# Patient Record
Sex: Female | Born: 1975 | Race: White | Hispanic: No | Smoking: Former smoker
Health system: Southern US, Community
[De-identification: ages and names within clinical notes are randomized; demographics above are authoritative.]

## PROBLEM LIST (undated history)

## (undated) ENCOUNTER — Inpatient Hospital Stay (HOSPITAL_COMMUNITY): Payer: Self-pay

## (undated) DIAGNOSIS — Z789 Other specified health status: Secondary | ICD-10-CM

## (undated) DIAGNOSIS — F419 Anxiety disorder, unspecified: Secondary | ICD-10-CM

## (undated) DIAGNOSIS — Z87891 Personal history of nicotine dependence: Secondary | ICD-10-CM

## (undated) HISTORY — DX: Anxiety disorder, unspecified: F41.9

## (undated) HISTORY — PX: TONSILLECTOMY: SUR1361

## (undated) HISTORY — PX: WISDOM TOOTH EXTRACTION: SHX21

---

## 2009-11-11 ENCOUNTER — Inpatient Hospital Stay (HOSPITAL_COMMUNITY): Admission: AD | Admit: 2009-11-11 | Discharge: 2009-11-13 | Payer: Self-pay | Admitting: Obstetrics and Gynecology

## 2010-07-14 LAB — CBC
HCT: 37.5 % (ref 36.0–46.0)
HCT: 41.9 % (ref 36.0–46.0)
Hemoglobin: 14.4 g/dL (ref 12.0–15.0)
MCH: 31.6 pg (ref 26.0–34.0)
MCHC: 34.3 g/dL (ref 30.0–36.0)
MCHC: 34.5 g/dL (ref 30.0–36.0)
MCV: 93.2 fL (ref 78.0–100.0)
RDW: 12.9 % (ref 11.5–15.5)

## 2010-07-14 LAB — RH IMMUNE GLOB WKUP(>/=20WKS)(NOT WOMEN'S HOSP)

## 2010-09-05 LAB — HIV ANTIBODY (ROUTINE TESTING W REFLEX): HIV: NONREACTIVE

## 2010-09-05 LAB — ABO/RH: RH Type: NEGATIVE

## 2010-09-05 LAB — HEPATITIS B SURFACE ANTIGEN: Hepatitis B Surface Ag: NEGATIVE

## 2010-09-05 LAB — RUBELLA ANTIBODY, IGM: Rubella: IMMUNE

## 2011-03-04 LAB — STREP B DNA PROBE: GBS: NEGATIVE

## 2011-03-26 ENCOUNTER — Encounter (HOSPITAL_COMMUNITY): Payer: Self-pay | Admitting: *Deleted

## 2011-03-26 ENCOUNTER — Inpatient Hospital Stay (HOSPITAL_COMMUNITY)
Admission: AD | Admit: 2011-03-26 | Discharge: 2011-03-28 | DRG: 373 | Disposition: A | Discharge status: Home / Self Care | Payer: BC Managed Care – PPO | Source: Ambulatory Visit | Attending: Obstetrics and Gynecology | Admitting: Obstetrics and Gynecology

## 2011-03-26 HISTORY — DX: Other specified health status: Z78.9

## 2011-03-26 NOTE — Progress Notes (Signed)
Pt reports contractions q 4 minutes. Denies bleeding or ROM. G2P1, SVE 3 cm this am

## 2011-03-26 NOTE — ED Notes (Signed)
Pt had office visit today; MDoffice SVE was 3 cm and MD stripped pt's membranes;

## 2011-03-27 ENCOUNTER — Encounter (HOSPITAL_COMMUNITY): Payer: Self-pay | Admitting: Anesthesiology

## 2011-03-27 ENCOUNTER — Encounter (HOSPITAL_COMMUNITY): Payer: Self-pay | Admitting: Family Medicine

## 2011-03-27 ENCOUNTER — Inpatient Hospital Stay (HOSPITAL_COMMUNITY): Payer: BC Managed Care – PPO | Admitting: Anesthesiology

## 2011-03-27 LAB — CBC
HCT: 39.6 % (ref 36.0–46.0)
Hemoglobin: 13.4 g/dL (ref 12.0–15.0)
RDW: 13.7 % (ref 11.5–15.5)
WBC: 12.1 10*3/uL — ABNORMAL HIGH (ref 4.0–10.5)

## 2011-03-27 MED ORDER — LACTATED RINGERS IV SOLN: 500.0000 mL | Freq: Once | INTRAVENOUS | Status: DC

## 2011-03-27 MED ORDER — WITCH HAZEL-GLYCERIN EX PADS: 1.0000 "application " | MEDICATED_PAD | CUTANEOUS | Status: DC | PRN

## 2011-03-27 MED ORDER — SODIUM BICARBONATE 8.4 % IV SOLN
INTRAVENOUS | Status: DC | PRN
  Administered 2011-03-27: 5 mL via EPIDURAL

## 2011-03-27 MED ORDER — ONDANSETRON HCL 4 MG/2ML IJ SOLN: 4.0000 mg | INTRAMUSCULAR | Status: DC | PRN

## 2011-03-27 MED ORDER — FENTANYL 2.5 MCG/ML BUPIVACAINE 1/10 % EPIDURAL INFUSION (WH - ANES)
INTRAMUSCULAR | Status: DC | PRN
  Administered 2011-03-27: 14 mL/h via EPIDURAL

## 2011-03-27 MED ORDER — OXYCODONE-ACETAMINOPHEN 5-325 MG PO TABS
1.0000 | ORAL_TABLET | ORAL | Status: DC | PRN
  Administered 2011-03-27 – 2011-03-28 (×4): 1 via ORAL
  Filled 2011-03-27 (×4): qty 1

## 2011-03-27 MED ORDER — DIPHENHYDRAMINE HCL 25 MG PO CAPS: 25.0000 mg | ORAL_CAPSULE | Freq: Four times a day (QID) | ORAL | Status: DC | PRN

## 2011-03-27 MED ORDER — PHENYLEPHRINE 40 MCG/ML (10ML) SYRINGE FOR IV PUSH (FOR BLOOD PRESSURE SUPPORT): 80.0000 ug | PREFILLED_SYRINGE | INTRAVENOUS | Status: DC | PRN

## 2011-03-27 MED ORDER — TETANUS-DIPHTH-ACELL PERTUSSIS 5-2.5-18.5 LF-MCG/0.5 IM SUSP: 0.5000 mL | Freq: Once | INTRAMUSCULAR | Status: DC

## 2011-03-27 MED ORDER — LIDOCAINE HCL (PF) 1 % IJ SOLN
30.0000 mL | INTRAMUSCULAR | Status: DC | PRN
  Filled 2011-03-27: qty 30

## 2011-03-27 MED ORDER — IBUPROFEN 600 MG PO TABS
600.0000 mg | ORAL_TABLET | Freq: Four times a day (QID) | ORAL | Status: DC
  Administered 2011-03-27 – 2011-03-28 (×5): 600 mg via ORAL
  Filled 2011-03-27 (×5): qty 1

## 2011-03-27 MED ORDER — EPHEDRINE 5 MG/ML INJ
10.0000 mg | INTRAVENOUS | Status: DC | PRN
  Filled 2011-03-27: qty 4

## 2011-03-27 MED ORDER — EPHEDRINE 5 MG/ML INJ: 10.0000 mg | INTRAVENOUS | Status: DC | PRN

## 2011-03-27 MED ORDER — OXYTOCIN BOLUS FROM INFUSION
500.0000 mL | Freq: Once | INTRAVENOUS | Status: DC
  Filled 2011-03-27: qty 1000
  Filled 2011-03-27: qty 500

## 2011-03-27 MED ORDER — OXYCODONE-ACETAMINOPHEN 5-325 MG PO TABS: 2.0000 | ORAL_TABLET | ORAL | Status: DC | PRN

## 2011-03-27 MED ORDER — SENNOSIDES-DOCUSATE SODIUM 8.6-50 MG PO TABS
2.0000 | ORAL_TABLET | Freq: Every day | ORAL | Status: DC
  Administered 2011-03-27: 2 via ORAL

## 2011-03-27 MED ORDER — FENTANYL 2.5 MCG/ML BUPIVACAINE 1/10 % EPIDURAL INFUSION (WH - ANES)
14.0000 mL/h | INTRAMUSCULAR | Status: DC
  Administered 2011-03-27: 14 mL/h via EPIDURAL
  Filled 2011-03-27 (×2): qty 60

## 2011-03-27 MED ORDER — BUTORPHANOL TARTRATE 2 MG/ML IJ SOLN: 1.0000 mg | INTRAMUSCULAR | Status: DC | PRN

## 2011-03-27 MED ORDER — FLEET ENEMA 7-19 GM/118ML RE ENEM: 1.0000 | ENEMA | RECTAL | Status: DC | PRN

## 2011-03-27 MED ORDER — LACTATED RINGERS IV SOLN
500.0000 mL | INTRAVENOUS | Status: DC | PRN
  Administered 2011-03-27 (×2): 1000 mL via INTRAVENOUS

## 2011-03-27 MED ORDER — BENZOCAINE-MENTHOL 20-0.5 % EX AERO
INHALATION_SPRAY | CUTANEOUS | Status: AC
  Administered 2011-03-27: 11:00:00
  Filled 2011-03-27: qty 56

## 2011-03-27 MED ORDER — DIPHENHYDRAMINE HCL 50 MG/ML IJ SOLN: 12.5000 mg | INTRAMUSCULAR | Status: DC | PRN

## 2011-03-27 MED ORDER — LANOLIN HYDROUS EX OINT: TOPICAL_OINTMENT | CUTANEOUS | Status: DC | PRN

## 2011-03-27 MED ORDER — BENZOCAINE-MENTHOL 20-0.5 % EX AERO: 1.0000 "application " | INHALATION_SPRAY | CUTANEOUS | Status: DC | PRN

## 2011-03-27 MED ORDER — OXYTOCIN 20 UNITS IN LACTATED RINGERS INFUSION - SIMPLE
125.0000 mL/h | Freq: Once | INTRAVENOUS | Status: AC
  Administered 2011-03-27: 999 mL/h via INTRAVENOUS

## 2011-03-27 MED ORDER — LACTATED RINGERS IV SOLN
INTRAVENOUS | Status: DC
  Administered 2011-03-27: 125 mL/h via INTRAVENOUS
  Administered 2011-03-27: via INTRAVENOUS
  Administered 2011-03-27: 125 mL/h via INTRAVENOUS

## 2011-03-27 MED ORDER — ACETAMINOPHEN 325 MG PO TABS: 650.0000 mg | ORAL_TABLET | ORAL | Status: DC | PRN

## 2011-03-27 MED ORDER — CITRIC ACID-SODIUM CITRATE 334-500 MG/5ML PO SOLN: 30.0000 mL | ORAL | Status: DC | PRN

## 2011-03-27 MED ORDER — SIMETHICONE 80 MG PO CHEW: 80.0000 mg | CHEWABLE_TABLET | ORAL | Status: DC | PRN

## 2011-03-27 MED ORDER — MEASLES, MUMPS & RUBELLA VAC ~~LOC~~ INJ
0.5000 mL | INJECTION | Freq: Once | SUBCUTANEOUS | Status: DC
  Filled 2011-03-27: qty 0.5

## 2011-03-27 MED ORDER — PHENYLEPHRINE 40 MCG/ML (10ML) SYRINGE FOR IV PUSH (FOR BLOOD PRESSURE SUPPORT)
80.0000 ug | PREFILLED_SYRINGE | INTRAVENOUS | Status: DC | PRN
  Filled 2011-03-27: qty 5

## 2011-03-27 MED ORDER — IBUPROFEN 600 MG PO TABS: 600.0000 mg | ORAL_TABLET | Freq: Four times a day (QID) | ORAL | Status: DC | PRN

## 2011-03-27 MED ORDER — ONDANSETRON HCL 4 MG/2ML IJ SOLN: 4.0000 mg | Freq: Four times a day (QID) | INTRAMUSCULAR | Status: DC | PRN

## 2011-03-27 MED ORDER — MEDROXYPROGESTERONE ACETATE 150 MG/ML IM SUSP: 150.0000 mg | INTRAMUSCULAR | Status: DC | PRN

## 2011-03-27 MED ORDER — DIBUCAINE 1 % RE OINT: 1.0000 "application " | TOPICAL_OINTMENT | RECTAL | Status: DC | PRN

## 2011-03-27 MED ORDER — ONDANSETRON HCL 4 MG PO TABS: 4.0000 mg | ORAL_TABLET | ORAL | Status: DC | PRN

## 2011-03-27 MED ORDER — PRENATAL PLUS 27-1 MG PO TABS
1.0000 | ORAL_TABLET | Freq: Every day | ORAL | Status: DC
  Administered 2011-03-27 – 2011-03-28 (×2): 1 via ORAL
  Filled 2011-03-27 (×2): qty 1

## 2011-03-27 NOTE — Progress Notes (Signed)
Transferred to [facility] 142, report given to [place], Charity fundraiser

## 2011-03-27 NOTE — Progress Notes (Signed)
SVD of vigerous female infant w/ apgars of 8,9.  Placenta delivered spontaneous w/ 3VC.   2nd degree lac repaired w/ 3-0 vicryl rapide.  Fundus firm.  EBL 400cc .

## 2011-03-27 NOTE — H&P (Signed)
35 yo G2P1 @39 +6 wks presents in labor.  No lof or vb.  + FM.  Pregnancy uncomplicated.  Past History - See hollister, GBS neg  AF, VSS Reassuring FHT, Toco Q2-4 Gen - uncomfortable w/ ctx Abd - gravid, NT Cvx 4cm at admission per RN exam  A/P:  Admit, epidural prn, exp mngt.

## 2011-03-27 NOTE — Anesthesia Procedure Notes (Signed)
Epidural Patient location during procedure: OB  Preanesthetic Checklist Completed: patient identified, site marked, surgical consent, pre-op evaluation, timeout performed, IV checked, risks and benefits discussed and monitors and equipment checked  Epidural Patient position: sitting Prep: site prepped and draped and DuraPrep Patient monitoring: continuous pulse ox and blood pressure Approach: midline Injection technique: LOR air  Needle:  Needle type: Tuohy  Needle gauge: 17 G Needle length: 9 cm Needle insertion depth: 7 cm Catheter type: closed end flexible Catheter size: 19 Gauge Catheter at skin depth: 15 cm Test dose: negative  Assessment Events: blood not aspirated, injection not painful, no injection resistance, negative IV test and no paresthesia  Additional Notes Dosing of Epidural:  1st dose, through needle ............................................Marland Kitchen epi 1:200K + Xylocaine 40 mg  2nd dose, through catheter, after waiting 3 minutes...Marland KitchenMarland Kitchenepi 1:200K + Xylocaine 60 mg  3rd dose, through catheter after waiting 3 minutes .............................Marcaine   5mg    ( mg Marcaine are expressed as equivilent  cc's medication removed from the 0.1%Bupiv / fentanyl syringe from L&D pump)  ( 2% Xylo charted as a single dose in Epic Meds for ease of charting; actual dosing was fractionated as above, for saftey's sake)  As each dose occurred, patient was free of IV sx; and patient exhibited no evidence of SA injection.  Patient is more comfortable after epidural dosed. Please see RN's note for documentation of vital signs,and FHR which are stable.

## 2011-03-27 NOTE — Progress Notes (Signed)
NSVD of a viable female.  

## 2011-03-27 NOTE — Anesthesia Preprocedure Evaluation (Signed)
Anesthesia Evaluation  Patient identified by MRN, date of birth, ID band Patient awake    Reviewed: Allergy & Precautions, H&P , Patient's Chart, lab work & pertinent test results  Airway Mallampati: II TM Distance: >3 FB Neck ROM: full    Dental  (+) Teeth Intact   Pulmonary  clear to auscultation        Cardiovascular regular Normal    Neuro/Psych    GI/Hepatic GERD-  ,  Endo/Other  Morbid obesity  Renal/GU      Musculoskeletal   Abdominal   Peds  Hematology   Anesthesia Other Findings       Reproductive/Obstetrics (+) Pregnancy                           Anesthesia Physical Anesthesia Plan  ASA: III  Anesthesia Plan: Epidural   Post-op Pain Management:    Induction:   Airway Management Planned:   Additional Equipment:   Intra-op Plan:   Post-operative Plan:   Informed Consent: I have reviewed the patients History and Physical, chart, labs and discussed the procedure including the risks, benefits and alternatives for the proposed anesthesia with the patient or authorized representative who has indicated his/her understanding and acceptance.   Dental Advisory Given  Plan Discussed with:   Anesthesia Plan Comments: (Labs checked- platelets confirmed with RN in room. Fetal heart tracing, per RN, reported to be stable enough for sitting procedure. Discussed epidural, and patient consents to the procedure:  included risk of possible headache,backache, failed block, allergic reaction, and nerve injury. This patient was asked if she had any questions or concerns before the procedure started. )        Anesthesia Quick Evaluation

## 2011-03-28 LAB — CBC
HCT: 34.3 % — ABNORMAL LOW (ref 36.0–46.0)
MCV: 89.8 fL (ref 78.0–100.0)
RBC: 3.82 MIL/uL — ABNORMAL LOW (ref 3.87–5.11)
WBC: 10.7 10*3/uL — ABNORMAL HIGH (ref 4.0–10.5)

## 2011-03-28 MED ORDER — OXYCODONE-ACETAMINOPHEN 5-325 MG PO TABS: 1.0000 | ORAL_TABLET | ORAL | Status: AC | PRN

## 2011-03-28 MED ORDER — IBUPROFEN 600 MG PO TABS: 600.0000 mg | ORAL_TABLET | Freq: Four times a day (QID) | ORAL | Status: AC

## 2011-03-28 NOTE — Anesthesia Postprocedure Evaluation (Signed)
  Anesthesia Post-op Note  Patient: [name] [name]  Procedure(s) Performed: * No procedures listed *  Patient Location: Mother/Baby  Anesthesia Type: Epidural  Level of Consciousness: alert  and oriented  Airway and Oxygen Therapy: Patient Spontanous Breathing  Post-op Pain: mild  Post-op Assessment: Patient's Cardiovascular Status Stable and Respiratory Function Stable  Post-op Vital Signs: stable  Complications: No apparent anesthesia complications

## 2011-03-28 NOTE — Addendum Note (Signed)
Addendum  created 03/28/11 1007 by Cephus Shelling   Modules edited:Charges VN, Notes Section

## 2011-03-28 NOTE — Discharge Summary (Signed)
Obstetric Discharge Summary Reason for Admission: onset of labor Prenatal Procedures: ultrasound Intrapartum Procedures: spontaneous vaginal delivery Postpartum Procedures: none Complications-Operative and Postpartum: 2 degree perineal laceration Hemoglobin  Date Value Range Status  03/28/2011 11.3* 12.0-15.0 ([name]/dL) Final     HCT  Date Value Range Status  03/28/2011 34.3* 36.0-46.0 (%) Final    Discharge Diagnoses: Term Pregnancy-delivered  Discharge Information: Date: 03/28/2011 Activity: pelvic rest Diet: routine Medications: None, Ibuprofen and Percocet Condition: stable Instructions: refer to practice specific booklet Discharge to: home   Newborn Data: Live born female  Birth Weight: 7 lb 15 oz (3600 [name]) APGAR: 8, 9  Home with mother.  [name] [name] 03/28/2011, 8:27 AM

## 2011-03-28 NOTE — Progress Notes (Signed)
Post Partum Day 1 Subjective: no complaints, up ad lib, voiding, + flatus and desires early discharge. Desires baby circ  Objective: Blood pressure 126/82, pulse 88, temperature 97.5 F (36.4 C), temperature source Oral, resp. rate 20, height 5\' 6"  (1.676 m), weight 113.853 kg (251 lb), SpO2 96.00%, unknown if currently breastfeeding.  Physical Exam:  General: alert and cooperative Lochia: appropriate Uterine Fundus: firm Perineum intact DVT Evaluation: No evidence of DVT seen on physical exam.   Basename 03/28/11 0520 03/27/11 0027  HGB 11.3* 13.4  HCT 34.3* 39.6    Assessment/Plan: Discharge home   LOS: 2 days   Emori Mumme G 03/28/2011, 8:17 AM

## 2011-03-28 NOTE — Progress Notes (Signed)
D/W patient female infant circumcision, risks/benefits reviewed. All questions answered. 

## 2011-04-01 ENCOUNTER — Encounter (HOSPITAL_COMMUNITY): Payer: Self-pay

## 2011-04-02 ENCOUNTER — Inpatient Hospital Stay (HOSPITAL_COMMUNITY): Admission: RE | Admit: 2011-04-02 | Payer: Self-pay | Source: Ambulatory Visit

## 2013-06-27 ENCOUNTER — Inpatient Hospital Stay (HOSPITAL_COMMUNITY): Payer: BC Managed Care – PPO

## 2013-06-27 ENCOUNTER — Encounter (HOSPITAL_COMMUNITY): Payer: Self-pay

## 2013-06-27 ENCOUNTER — Inpatient Hospital Stay (HOSPITAL_COMMUNITY)
Admission: AD | Admit: 2013-06-27 | Discharge: 2013-06-27 | Disposition: A | Discharge status: Home / Self Care | Payer: BC Managed Care – PPO | Source: Ambulatory Visit | Attending: Obstetrics and Gynecology | Admitting: Obstetrics and Gynecology

## 2013-06-27 DIAGNOSIS — O09529 Supervision of elderly multigravida, unspecified trimester: Secondary | ICD-10-CM

## 2013-06-27 DIAGNOSIS — O209 Hemorrhage in early pregnancy, unspecified: Secondary | ICD-10-CM | POA: Insufficient documentation

## 2013-06-27 DIAGNOSIS — O200 Threatened abortion: Secondary | ICD-10-CM

## 2013-06-27 DIAGNOSIS — Z87891 Personal history of nicotine dependence: Secondary | ICD-10-CM | POA: Insufficient documentation

## 2013-06-27 HISTORY — DX: Other specified health status: Z78.9

## 2013-06-27 LAB — HCG, QUANTITATIVE, PREGNANCY: hCG, Beta Chain, Quant, S: 10561 m[IU]/mL — ABNORMAL HIGH (ref <5)

## 2013-06-27 LAB — URINALYSIS, ROUTINE W REFLEX MICROSCOPIC
Bilirubin Urine: NEGATIVE
Glucose, UA: NEGATIVE mg/dL
Ketones, ur: NEGATIVE mg/dL
Leukocytes, UA: NEGATIVE
Nitrite: NEGATIVE
Protein, ur: NEGATIVE mg/dL
Specific Gravity, Urine: <1.005 — ABNORMAL LOW (ref 1.005–1.030)
Urobilinogen, UA: 0.2 mg/dL (ref 0.0–1.0)
pH: 5.5 (ref 5.0–8.0)

## 2013-06-27 LAB — CBC
HCT: 39.1 % (ref 36.0–46.0)
Hemoglobin: 13 g/dL (ref 12.0–15.0)
MCH: 29 pg (ref 26.0–34.0)
MCHC: 33.2 g/dL (ref 30.0–36.0)
MCV: 87.1 fL (ref 78.0–100.0)
Platelets: 310 10*3/uL (ref 150–400)
RBC: 4.49 MIL/uL (ref 3.87–5.11)
RDW: 13.9 % (ref 11.5–15.5)
WBC: 8.4 10*3/uL (ref 4.0–10.5)

## 2013-06-27 LAB — URINE MICROSCOPIC-ADD ON

## 2013-06-27 LAB — POCT PREGNANCY, URINE: Preg Test, Ur: POSITIVE — AB

## 2013-06-27 MED ORDER — RHO D IMMUNE GLOBULIN 1500 UNIT/2ML IJ SOLN
300.0000 ug | INTRAMUSCULAR | Status: AC
  Administered 2013-06-27: 300 ug via INTRAMUSCULAR
  Filled 2013-06-27: qty 2

## 2013-06-27 NOTE — MAU Provider Note (Signed)
Chief Complaint: Vaginal Bleeding   First Provider Initiated Contact with Patient 06/27/13 0913     SUBJECTIVE HPI: [name] [name] is a 38 y.o. G4P2002 at [redacted]w[redacted]d by LMP who presents to maternity admissions reporting bright red vaginal bleeding described as soaking 1 pad in 2-3 hours, "like menstrual bleeding".  Her bleeding is accompanied by light cramping with onset this morning.  She reports she had brown spotting x2-3 days, then had bright red only when wiping yesterday and had appointment for labs in office tomorrow.  Patient's last menstrual period was 05/05/2013.  She is sure of the date but this period was unusually long with moderate bleeding, then spotting, then moderate bleeding again. She denies exposure to STDs, vaginal itching/burning, urinary symptoms, h/a, dizziness, n/v, or fever/chills.     Past Medical History  Diagnosis Date  . No pertinent past medical history   . Medical history non-contributory    Past Surgical History  Procedure Laterality Date  . Tonsillectomy    . Wisdom tooth extraction     History   Social History  . Marital Status: Married    Spouse Name: N/A    Number of Children: N/A  . Years of Education: N/A   Occupational History  . Not on file.   Social History Main Topics  . Smoking status: Former Games developer  . Smokeless tobacco: Never Used  . Alcohol Use: No  . Drug Use: No  . Sexual Activity: Yes    Birth Control/ Protection: None   Other Topics Concern  . Not on file   Social History Narrative  . No narrative on file   No current facility-administered medications on file prior to encounter.   No current outpatient prescriptions on file prior to encounter.   No Known Allergies  ROS: Pertinent items in HPI  OBJECTIVE Blood pressure 127/83, pulse 80, temperature 97.9 F (36.6 C), temperature source Oral, resp. rate 18, height 5\' 6"  (1.676 m), weight 85.503 kg (188 lb 8 oz), last menstrual period 05/05/2013, not currently  breastfeeding. GENERAL: Well-developed, well-nourished female in no acute distress.  HEENT: Normocephalic HEART: normal rate RESP: normal effort ABDOMEN: Soft, non-tender EXTREMITIES: Nontender, no edema NEURO: Alert and oriented Pelvic exam: Cervix pink, visually closed, without lesion, moderate amount dark red blood, vaginal walls and external genitalia normal Bimanual exam: Cervix 0/long/high, firm, anterior, neg CMT, uterus nontender, slightly enlarged, adnexa without tenderness, enlargement, or mass  LAB RESULTS Results for orders placed during the hospital encounter of 06/27/13 (from the past 24 hour(s))  URINALYSIS, ROUTINE W REFLEX MICROSCOPIC     Status: Abnormal   Collection Time    06/27/13  8:25 AM      Result Value Ref Range   Color, Urine YELLOW  YELLOW   APPearance CLEAR  CLEAR   Specific Gravity, Urine <1.005 (*) 1.005 - 1.030   pH 5.5  5.0 - 8.0   Glucose, UA NEGATIVE  NEGATIVE mg/dL   Hgb urine dipstick MODERATE (*) NEGATIVE   Bilirubin Urine NEGATIVE  NEGATIVE   Ketones, ur NEGATIVE  NEGATIVE mg/dL   Protein, ur NEGATIVE  NEGATIVE mg/dL   Urobilinogen, UA 0.2  0.0 - 1.0 mg/dL   Nitrite NEGATIVE  NEGATIVE   Leukocytes, UA NEGATIVE  NEGATIVE  URINE MICROSCOPIC-ADD ON     Status: None   Collection Time    06/27/13  8:25 AM      Result Value Ref Range   Squamous Epithelial / LPF RARE  RARE   RBC /  HPF 3-6  <3 RBC/hpf  POCT PREGNANCY, URINE     Status: Abnormal   Collection Time    06/27/13  8:44 AM      Result Value Ref Range   Preg Test, Ur POSITIVE (*) NEGATIVE  CBC     Status: None   Collection Time    06/27/13  9:14 AM      Result Value Ref Range   WBC 8.4  4.0 - 10.5 K/uL   RBC 4.49  3.87 - 5.11 MIL/uL   Hemoglobin 13.0  12.0 - 15.0 g/dL   HCT 96.0  45.4 - 09.8 %   MCV 87.1  78.0 - 100.0 fL   MCH 29.0  26.0 - 34.0 pg   MCHC 33.2  30.0 - 36.0 g/dL   RDW 11.9  14.7 - 82.9 %   Platelets 310  150 - 400 K/uL  HCG, QUANTITATIVE, PREGNANCY     Status:  Abnormal   Collection Time    06/27/13  9:14 AM      Result Value Ref Range   hCG, Beta Chain, Quant, S 10561 (*) <5 mIU/mL  RH IG WORKUP (INCLUDES ABO/RH)     Status: None   Collection Time    06/27/13  9:15 AM      Result Value Ref Range   Gestational Age(Wks) 6     ABO/RH(D) O NEG     Antibody Screen NEG     Unit Number [id number]     Blood Component Type RHIG     Unit division 00     Status of Unit ISSUED     Transfusion Status OK TO TRANSFUSE      IMAGING US Ob Comp Less 14 Wks  06/27/2013   CLINICAL DATA:  Vaginal bleeding, cramping, pregnant, gestational age of [redacted] weeks, 4 days by LMP  EXAM: OBSTETRIC <14 WK [place] AND TRANSVAGINAL OB US  TECHNIQUE: Both transabdominal and transvaginal ultrasound examinations were performed for complete evaluation of the gestation as well as the maternal uterus, adnexal regions, and pelvic cul-de-sac. Transvaginal technique was performed to assess early pregnancy.  COMPARISON:  None.  FINDINGS: Intrauterine gestational sac: Visualized, normal in shape, note is made of a small subchorionic hemorrhage measuring approximately 1.2 cm (image 42).  Yolk sac:  Visualized  Embryo:  Visualized  Cardiac Activity: Not definitely visualized.  CRL:   3  mm   6 w 0 d                  [place] EDC: 02/20/2014  Maternal uterus/adnexae:  The left ovary is normal in size measuring 3.2 x 1.9 x 1.9 cm. Several small punctate peripheral follicles are noted within the left ovary. No discrete left sided ovarian or adnexal mass.  The right ovary is normal in size measuring 3.4 x 2.1 x 2.3 cm. Several small peripheral follicles are noted within the right ovary. No discrete right-sided ovarian or adnexal mass.  IMPRESSION: 1. Single intrauterine gestation. Cardiac activity is not definitely identified though crown-rump length only measures 3 mm compatible with a [redacted] week gestation (estimated delivery date of 02/20/2014). Follow-up pelvic ultrasound to ensure viability is recommended. 2.  Incidental note made of a small (approximately 1.2 cm) subchorionic hemorrhage. 3. Normal appearance of the bilateral ovaries and adnexae.   Electronically Signed   By: Simonne Come M.D.   On: 06/27/2013 10:41   US Ob Transvaginal  06/27/2013   CLINICAL DATA:  Vaginal bleeding, cramping, pregnant, gestational age of  [redacted] weeks, 4 days by LMP  EXAM: OBSTETRIC <14 WK US AND TRANSVAGINAL OB US  TECHNIQUE: Both transabdominal and transvaginal ultrasound examinations were performed for complete evaluation of the gestation as well as the maternal uterus, adnexal regions, and pelvic cul-de-sac. Transvaginal technique was performed to assess early pregnancy.  COMPARISON:  None.  FINDINGS: Intrauterine gestational sac: Visualized, normal in shape, note is made of a small subchorionic hemorrhage measuring approximately 1.2 cm (image 42).  Yolk sac:  Visualized  Embryo:  Visualized  Cardiac Activity: Not definitely visualized.  CRL:   3  mm   6 w 0 d                  US EDC: 02/20/2014  Maternal uterus/adnexae:  The left ovary is normal in size measuring 3.2 x 1.9 x 1.9 cm. Several small punctate peripheral follicles are noted within the left ovary. No discrete left sided ovarian or adnexal mass.  The right ovary is normal in size measuring 3.4 x 2.1 x 2.3 cm. Several small peripheral follicles are noted within the right ovary. No discrete right-sided ovarian or adnexal mass.  IMPRESSION: 1. Single intrauterine gestation. Cardiac activity is not definitely identified though crown-rump length only measures 3 mm compatible with a [redacted] week gestation (estimated delivery date of 02/20/2014). Follow-up pelvic ultrasound to ensure viability is recommended. 2. Incidental note made of a small (approximately 1.2 cm) subchorionic hemorrhage. 3. Normal appearance of the bilateral ovaries and adnexae.   Electronically Signed   By: Simonne ComeJohn  Watts M.D.   On: 06/27/2013 10:41    ASSESSMENT Vaginal bleeding in pregnancy Small SCH  IUP 2228w1d by  U/S  PLAN Consult Dr Arelia SneddonMcComb Rhogam given in MAU Discharge home with bleeding precautions Pelvic rest F/U in office with early prenatal care Return to MAU as needed    Medication List         acetaminophen 500 MG tablet  Commonly known as:  TYLENOL  Take 500 mg by mouth every 6 (six) hours as needed for headache.       Follow-up Information   Follow up with Juluis MireMCCOMB,JOHN S, MD. (Keep scheduled appointments. Return to MAU as needed.  )    Specialty:  Obstetrics and Gynecology   Contact information:   [address], [address], [address] [place] [id number] [phone]       Sharen CounterLisa Leftwich-Kirby Certified Nurse-Midwife 06/27/2013  3:08 PM

## 2013-06-27 NOTE — MAU Note (Addendum)
Pt states has been having brown blood then bright red blood intermittently. Was to go to MD office tomorrow for bloodwork to check hormone level. Here today with heavy bleeding. No clots noted.

## 2013-06-27 NOTE — Discharge Instructions (Signed)
Vaginal Bleeding During Pregnancy, First Trimester °A small amount of bleeding (spotting) from the vagina is relatively common in early pregnancy. It usually stops on its own. Various things may cause bleeding or spotting in early pregnancy. Some bleeding may be related to the pregnancy, and some may not. In most cases, the bleeding is normal and is not a problem. However, bleeding can also be a sign of something serious. Be sure to tell your health care provider about any vaginal bleeding right away. °Some possible causes of vaginal bleeding during the first trimester include: °· Infection or inflammation of the cervix. °· Growths (polyps) on the cervix. °· Miscarriage or threatened miscarriage. °· Pregnancy tissue has developed outside of the uterus and in a fallopian tube (tubal pregnancy). °· Tiny cysts have developed in the uterus instead of pregnancy tissue (molar pregnancy). °HOME CARE INSTRUCTIONS  °Watch your condition for any changes. The following actions may help to lessen any discomfort you are feeling: °· Follow your health care provider's instructions for limiting your activity. If your health care provider orders bed rest, you may need to stay in bed and only get up to use the bathroom. However, your health care provider may allow you to continue light activity. °· If needed, make plans for someone to help with your regular activities and responsibilities while you are on bed rest. °· Keep track of the number of pads you use each day, how often you change pads, and how soaked (saturated) they are. Write this down. °· Do not use tampons. Do not douche. °· Do not have sexual intercourse or orgasms until approved by your health care provider. °· If you pass any tissue from your vagina, save the tissue so you can show it to your health care provider. °· Only take over-the-counter or prescription medicines as directed by your health care provider. °· Do not take aspirin because it can make you  bleed. °· Keep all follow-up appointments as directed by your health care provider. °SEEK MEDICAL CARE IF: °· You have any vaginal bleeding during any part of your pregnancy. °· You have cramps or labor pains. °SEEK IMMEDIATE MEDICAL CARE IF:  °· You have severe cramps in your back or belly (abdomen). °· You have a fever, not controlled by medicine. °· You pass large clots or tissue from your vagina. °· Your bleeding increases. °· You feel lightheaded or weak, or you have fainting episodes. °· You have chills. °· You are leaking fluid or have a gush of fluid from your vagina. °· You pass out while having a bowel movement. °MAKE SURE YOU: °· Understand these instructions. °· Will watch your condition. °· Will get help right away if you are not doing well or get worse. °Document Released: 01/23/2005 Document Revised: 02/03/2013 Document Reviewed: 12/21/2012 °[employer]® Patient Information ©2014 [employer]. ° °

## 2013-06-28 ENCOUNTER — Encounter (HOSPITAL_COMMUNITY): Payer: Self-pay

## 2013-06-28 ENCOUNTER — Inpatient Hospital Stay (HOSPITAL_COMMUNITY)
Admission: AD | Admit: 2013-06-28 | Discharge: 2013-06-28 | Disposition: A | Discharge status: Home / Self Care | Payer: BC Managed Care – PPO | Source: Ambulatory Visit | Attending: Obstetrics and Gynecology | Admitting: Obstetrics and Gynecology

## 2013-06-28 ENCOUNTER — Inpatient Hospital Stay (HOSPITAL_COMMUNITY): Payer: BC Managed Care – PPO

## 2013-06-28 DIAGNOSIS — O039 Complete or unspecified spontaneous abortion without complication: Secondary | ICD-10-CM | POA: Insufficient documentation

## 2013-06-28 DIAGNOSIS — Z87891 Personal history of nicotine dependence: Secondary | ICD-10-CM | POA: Insufficient documentation

## 2013-06-28 DIAGNOSIS — R109 Unspecified abdominal pain: Secondary | ICD-10-CM | POA: Insufficient documentation

## 2013-06-28 LAB — RH IG WORKUP (INCLUDES ABO/RH)
ABO/RH(D): O NEG
Antibody Screen: NEGATIVE
GESTATIONAL AGE(WKS): 6
Unit division: 0

## 2013-06-28 LAB — HCG, QUANTITATIVE, PREGNANCY: HCG, BETA CHAIN, QUANT, S: 9086 m[IU]/mL — AB (ref <5)

## 2013-06-28 MED ORDER — IBUPROFEN 800 MG PO TABS: 800.0000 mg | ORAL_TABLET | Freq: Three times a day (TID) | ORAL | Status: DC | PRN

## 2013-06-28 MED ORDER — IBUPROFEN 800 MG PO TABS
800.0000 mg | ORAL_TABLET | Freq: Once | ORAL | Status: AC
Start: 2013-06-28 — End: 2013-06-28
  Administered 2013-06-28: 800 mg via ORAL
  Filled 2013-06-28: qty 1

## 2013-06-28 MED ORDER — OXYCODONE-ACETAMINOPHEN 5-325 MG PO TABS
1.0000 | ORAL_TABLET | Freq: Once | ORAL | Status: AC
Start: 2013-06-28 — End: 2013-06-28
  Administered 2013-06-28: 1 via ORAL
  Filled 2013-06-28: qty 1

## 2013-06-28 MED ORDER — OXYCODONE-ACETAMINOPHEN 5-325 MG PO TABS: 1.0000 | ORAL_TABLET | Freq: Once | ORAL | Status: DC

## 2013-06-28 MED ORDER — METHYLERGONOVINE MALEATE 0.2 MG PO TABS: 0.2000 mg | ORAL_TABLET | Freq: Four times a day (QID) | ORAL | Status: DC

## 2013-06-28 NOTE — Progress Notes (Signed)
Verlon AuLeslie and her family were tearful and still very much in shock.  She preferred to be alone with her family for now, but she is aware of on-going support resources including [facility] and the availability of our Spiritual Care Department.  Centex CorporationChaplain Katy Stephene Alegria Pager, [phone] 10:33 AM   06/28/13 1000  Clinical Encounter Type  Visited With Patient and family together  Visit Type Initial;Spiritual support  Referral From Nurse  Spiritual Encounters  Spiritual Needs Emotional;Grief support  Stress Factors  Patient Stress Factors Loss

## 2013-06-28 NOTE — MAU Provider Note (Signed)
History     CSN: [id number]  Arrival date and time: 06/28/13 0700   First Provider Initiated Contact with Patient 06/28/13 [id number]      Chief Complaint  Patient presents with  . Vaginal Bleeding  . Abdominal Pain   HPI  [name] [name] is a 38 y.o. [phone] at [redacted]w[redacted]d who presents today with cramping. She was seen here yesterday and states that she was up all night with cramping. She also states that her bleeding has increased at this time. Ultrasound yesterday showed an IUP. No cardiac activity at the time, but fetal pole was only measuring 3mm. She also was noted to have had a small subchorionic hemorrhage. She was given rhogam when here yesterday.   Past Medical History  Diagnosis Date  . No pertinent past medical history   . Medical history non-contributory     Past Surgical History  Procedure Laterality Date  . Tonsillectomy    . Wisdom tooth extraction      No family history on file.  History  Substance Use Topics  . Smoking status: Former Games developer  . Smokeless tobacco: Never Used  . Alcohol Use: No    Allergies: No Known Allergies  Prescriptions prior to admission  Medication Sig Dispense Refill  . acetaminophen (TYLENOL) 500 MG tablet Take 1,000 mg by mouth every 6 (six) hours as needed for headache.          Results for orders placed during the hospital encounter of 06/28/13 (from the past 48 hour(s))  HCG, QUANTITATIVE, PREGNANCY     Status: Abnormal   Collection Time    06/28/13  7:10 AM      Result Value Ref Range   hCG, Beta Chain, Quant, S 9086 (*) <5 mIU/mL   Comment:              GEST. AGE      CONC.  (mIU/mL)       <=1 WEEK        5 - 50         2 WEEKS       50 - 500         3 WEEKS       100 - 10,000         4 WEEKS     1,000 - 30,000         5 WEEKS     3,500 - 115,000       6-8 WEEKS     12,000 - 270,000        12 WEEKS     15,000 - 220,000                FEMALE AND NON-PREGNANT FEMALE:         LESS THAN 5 mIU/mL   US Ob Comp Less 14  Wks  06/27/2013   CLINICAL DATA:  Vaginal bleeding, cramping, pregnant, gestational age of [redacted] weeks, 4 days by LMP  EXAM: OBSTETRIC <14 WK [place] AND TRANSVAGINAL OB US  TECHNIQUE: Both transabdominal and transvaginal ultrasound examinations were performed for complete evaluation of the gestation as well as the maternal uterus, adnexal regions, and pelvic cul-de-sac. Transvaginal technique was performed to assess early pregnancy.  COMPARISON:  None.  FINDINGS: Intrauterine gestational sac: Visualized, normal in shape, note is made of a small subchorionic hemorrhage measuring approximately 1.2 cm (image 42).  Yolk sac:  Visualized  Embryo:  Visualized  Cardiac Activity: Not definitely visualized.  CRL:   3  mm   6 w 0 d                  [place] EDC: 02/20/2014  Maternal uterus/adnexae:  The left ovary is normal in size measuring 3.2 x 1.9 x 1.9 cm. Several small punctate peripheral follicles are noted within the left ovary. No discrete left sided ovarian or adnexal mass.  The right ovary is normal in size measuring 3.4 x 2.1 x 2.3 cm. Several small peripheral follicles are noted within the right ovary. No discrete right-sided ovarian or adnexal mass.  IMPRESSION: 1. Single intrauterine gestation. Cardiac activity is not definitely identified though crown-rump length only measures 3 mm compatible with a [redacted] week gestation (estimated delivery date of 02/20/2014). Follow-up pelvic ultrasound to ensure viability is recommended. 2. Incidental note made of a small (approximately 1.2 cm) subchorionic hemorrhage. 3. Normal appearance of the bilateral ovaries and adnexae.   Electronically Signed   By: Simonne Come M.D.   On: 06/27/2013 10:41   US Ob Transvaginal  06/28/2013   CLINICAL DATA:  Decreasing quantitative beta HCG  EXAM: TRANSVAGINAL OB ULTRASOUND  TECHNIQUE: Transvaginal ultrasound was performed for complete evaluation of the gestation as well as the maternal uterus, adnexal regions, and pelvic cul-de-sac.  COMPARISON:  None.   FINDINGS: Intrauterine gestational sac: None.  Yolk sac:  None.  Embryo:  None.  Cardiac Activity: None.  Heart Rate: None detected.  Maternal uterus/adnexae: Bilateral ovaries are normal in size and echogenicity. The right ovary measures 2.8 x 2 x 2.1 cm. The left ovary measures 2.8 x 1.7 x 1.9 cm.  IMPRESSION: No intrauterine pregnancy identified. Serial beta HCG correlation recommended.   Electronically Signed   By: Elige Ko   On: 06/28/2013 09:40   US Ob Transvaginal  06/27/2013   CLINICAL DATA:  Vaginal bleeding, cramping, pregnant, gestational age of [redacted] weeks, 4 days by LMP  EXAM: OBSTETRIC <14 WK [place] AND TRANSVAGINAL OB US  TECHNIQUE: Both transabdominal and transvaginal ultrasound examinations were performed for complete evaluation of the gestation as well as the maternal uterus, adnexal regions, and pelvic cul-de-sac. Transvaginal technique was performed to assess early pregnancy.  COMPARISON:  None.  FINDINGS: Intrauterine gestational sac: Visualized, normal in shape, note is made of a small subchorionic hemorrhage measuring approximately 1.2 cm (image 42).  Yolk sac:  Visualized  Embryo:  Visualized  Cardiac Activity: Not definitely visualized.  CRL:   3  mm   6 w 0 d                  [place] EDC: 02/20/2014  Maternal uterus/adnexae:  The left ovary is normal in size measuring 3.2 x 1.9 x 1.9 cm. Several small punctate peripheral follicles are noted within the left ovary. No discrete left sided ovarian or adnexal mass.  The right ovary is normal in size measuring 3.4 x 2.1 x 2.3 cm. Several small peripheral follicles are noted within the right ovary. No discrete right-sided ovarian or adnexal mass.  IMPRESSION: 1. Single intrauterine gestation. Cardiac activity is not definitely identified though crown-rump length only measures 3 mm compatible with a [redacted] week gestation (estimated delivery date of 02/20/2014). Follow-up pelvic ultrasound to ensure viability is recommended. 2. Incidental note made of a small  (approximately 1.2 cm) subchorionic hemorrhage. 3. Normal appearance of the bilateral ovaries and adnexae.   Electronically Signed   By: Simonne Come M.D.   On: 06/27/2013 10:41    Review of Systems  Genitourinary:  No vaginal discharge + vaginal bleeding. No dysuria.    Physical Exam   Blood pressure 122/74, pulse 70, temperature 97.6 F (36.4 C), temperature source Oral, resp. rate 16, last menstrual period 05/05/2013.  Physical Exam  Nursing note and vitals reviewed. Constitutional: She is oriented to person, place, and time. She appears well-developed and well-nourished. No distress.  Cardiovascular: Normal rate.   Respiratory: Effort normal.  GI: Soft. There is no tenderness.  Neurological: She is alert and oriented to person, place, and time.  Skin: Skin is warm and dry.  Psychiatric: She has a normal mood and affect.    MAU Course  Procedures None  HCG pending Ibuprofen 800mg  0800 care turned over Blanche EastJ. Terissa Haffey, NP [phone]; discussed US report with Dr. Renaldo FiddlerAdkins, she will come down and talk to the patient in MAU. Patient sees Dr. Rana SnareLowe in the office.   Assessment and Plan   A:  SAB  P:  Discharge home in stable condition Follow up with the office in 1 week  RX: Percocet- per Dr. Renaldo FiddlerAdkins  Bleeding precautions discussed Support given    Tawnya CrookHogan, Heather Donovan 06/28/2013, 7:47 AM   Iona HansenJennifer Irene Lyall Faciane, NP 06/28/2013 2:16 PM

## 2013-06-28 NOTE — H&P (Signed)
38 yo G4P2 presents with VB and cramping that has persisted overnight.  Pt was evalulated yesterday and noted to have IUGS, no Fetal pole.  She did receive rhogam yesterday.    AF, VSS Gen - tearful, appropriate Abd - soft, NT/nd PV - deferred  Quant - 9K today (decreased from 10561 yesterday) US - no IUGS  A/P:  SAB S/p rhogam D/c home with rx for percocet and methergine F/u office one week

## 2013-06-28 NOTE — Discharge Instructions (Signed)
Miscarriage  A miscarriage is the loss of an unborn baby (fetus) before the 20th week of pregnancy. The cause is often unknown.   HOME CARE  · You may need to stay in bed (bed rest), or you may be able to do light activity. Go about activity as told by your doctor.  · Have help at home.  · Write down how many pads you use each day. Write down how soaked they are.  · Do not use tampons. Do not wash out your vagina (douche) or have sex (intercourse) until your doctor approves.  · Only take medicine as told by your doctor.  · Do not take aspirin.  · Keep all doctor visits as told.  · If you or your partner have problems with grieving, talk to your doctor. You can also try counseling. Give yourself time to grieve before trying to get pregnant again.  GET HELP RIGHT AWAY IF:  · You have bad cramps or pain in your back or belly (abdomen).  · You have a fever.  · You pass large clumps of blood (clots) from your vagina that are walnut-sized or larger. Save the clumps for your doctor to see.  · You pass large amounts of tissue from your vagina. Save the tissue for your doctor to see.  · You have more bleeding.  · You have thick, bad-smelling fluid (discharge) coming from the vagina.  · You get lightheaded, weak, or you pass out (faint).  · You have chills.  MAKE SURE YOU:  · Understand these instructions.  · Will watch your condition.  · Will get help right away if you are not doing well or get worse.  Document Released: 07/08/2011 Document Reviewed: 07/08/2011  Exit[employer]® Patient Information ©2014 [employer].

## 2013-06-28 NOTE — MAU Note (Signed)
Patient states she was in MAU yesterday. States she has had cramping through the night and not able to sleep. Has had light bleeding.

## 2014-02-28 ENCOUNTER — Encounter (HOSPITAL_COMMUNITY): Payer: Self-pay

## 2014-04-29 NOTE — L&D Delivery Note (Signed)
Delivery Note At 4:39 PM a viable female was delivered via  (Presentation: ;  ).  APGAR: , ; weight  .   Placenta status:manually removed>>>intact , .  Cord:  with the following complications: .  Cord pH: not sent  Anesthesia: epid  Episiotomy:  none Lacerations:  none Suture Repair: none Est. Blood Loss (mL): 300   Mom to postpartum.  Baby to Couplet care / Skin to Skin   For PPTL>>>procedure + permanence/failure rate reviewed.  [name] [name],[name] [name] 04/26/2015, 4:58 PM

## 2014-05-02 ENCOUNTER — Encounter (HOSPITAL_COMMUNITY): Payer: Self-pay | Admitting: *Deleted

## 2014-10-03 LAB — OB RESULTS CONSOLE GC/CHLAMYDIA
Chlamydia: NEGATIVE
Gonorrhea: NEGATIVE

## 2014-10-03 LAB — OB RESULTS CONSOLE HIV ANTIBODY (ROUTINE TESTING): HIV: NONREACTIVE

## 2014-10-23 IMAGING — US US OB TRANSVAGINAL
1 series · 14 of 28 positions shown · non-contrast
Comparison: None.

CLINICAL DATA: Decreasing quantitative beta HCG

EXAM:
TRANSVAGINAL OB ULTRASOUND
TECHNIQUE: Transvaginal ultrasound was performed for complete evaluation of the
gestation as well as the maternal uterus, adnexal regions, and
pelvic cul-de-sac.

[Series 1: us ob transvaginal · 35 acquisitions, 14 frames shown]
[im 2/35]
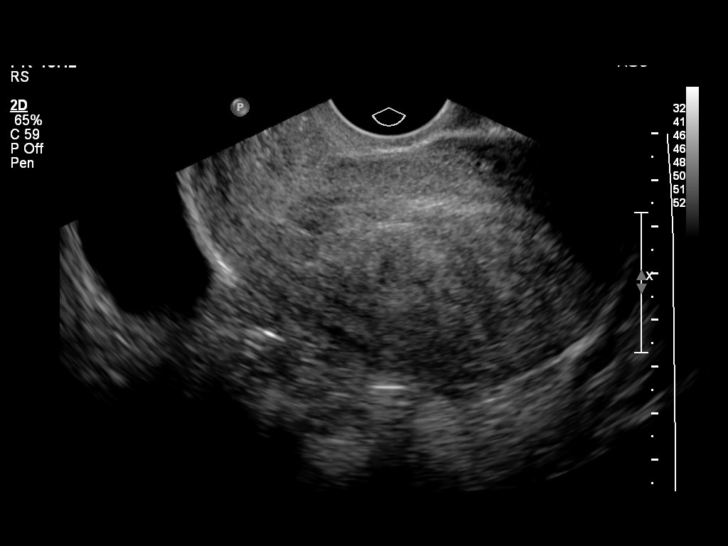
[im 4/35]
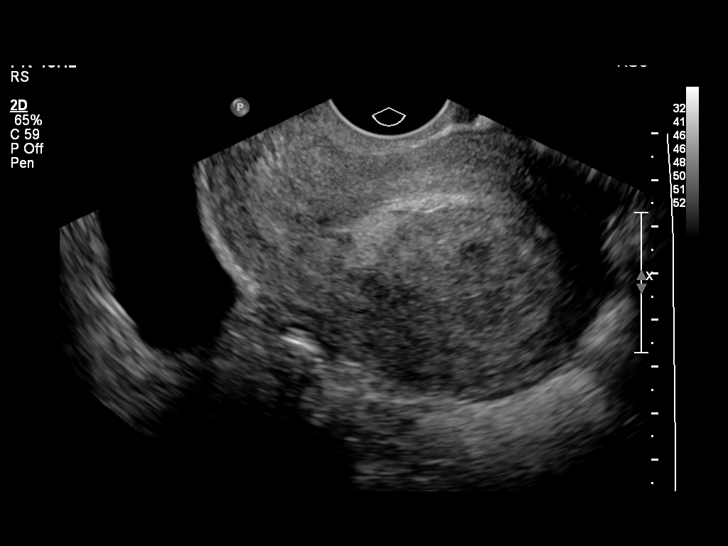
[im 7/35]
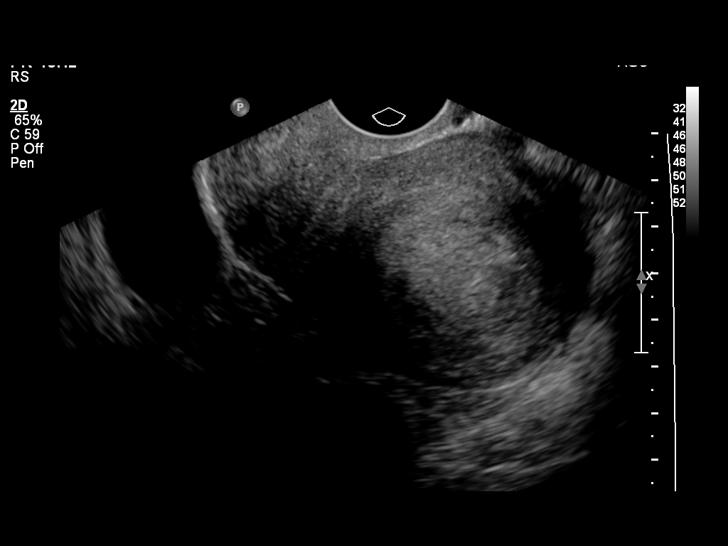
[im 9/35]
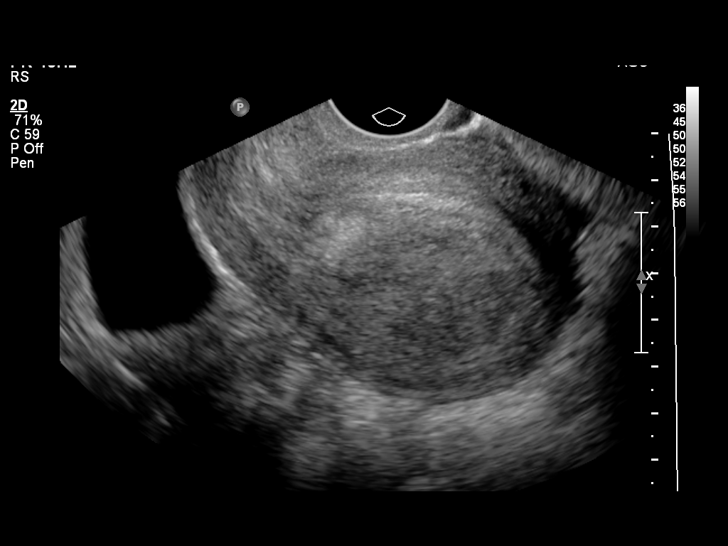
[im 12/35]
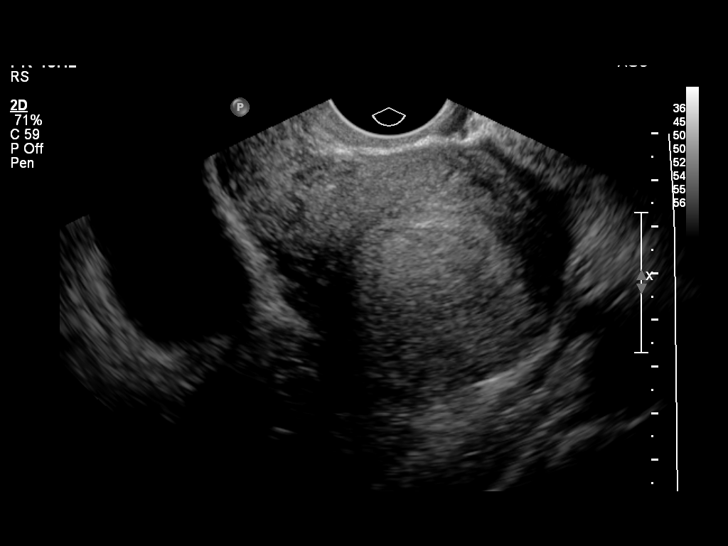
[im 14/35]
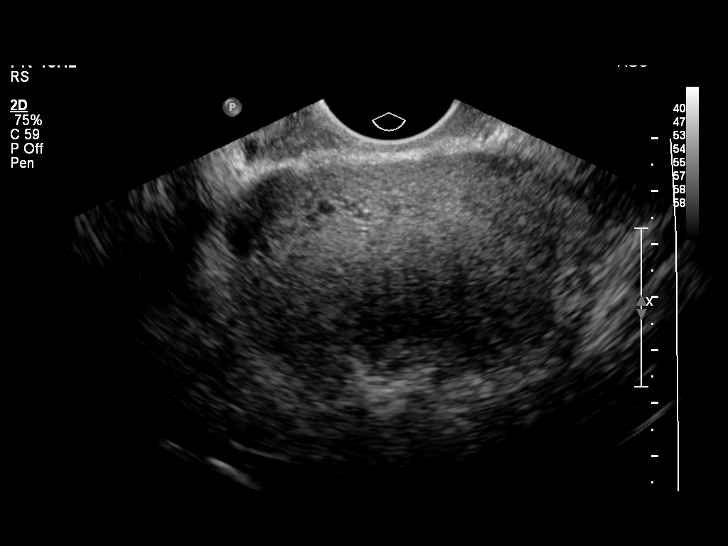
[im 17/35]
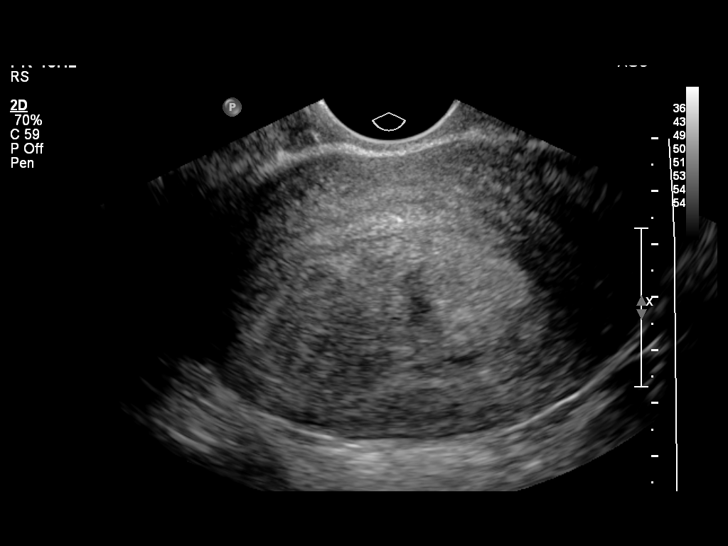
[im 19/35]
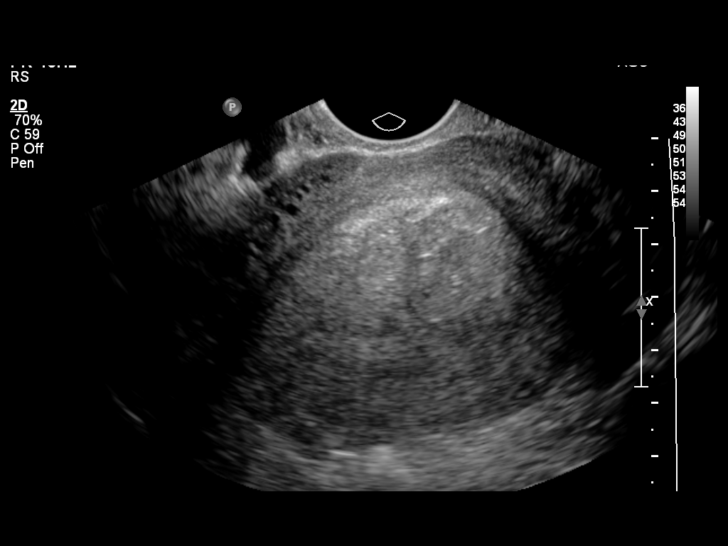
[im 22/35]
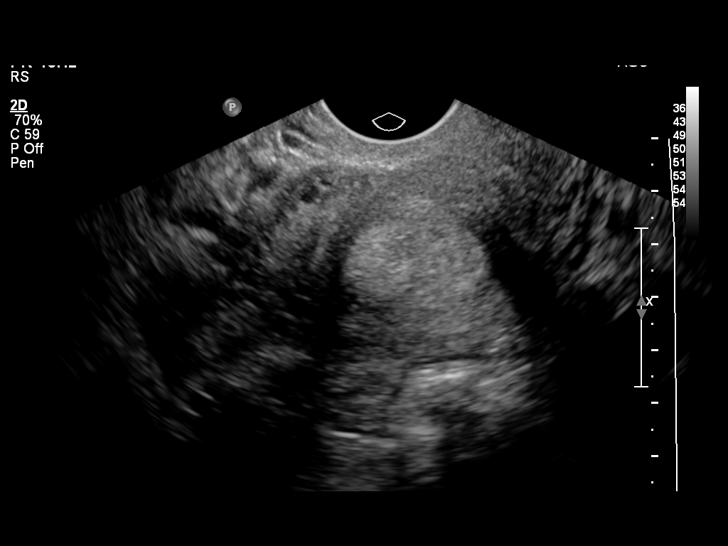
[im 24/35]
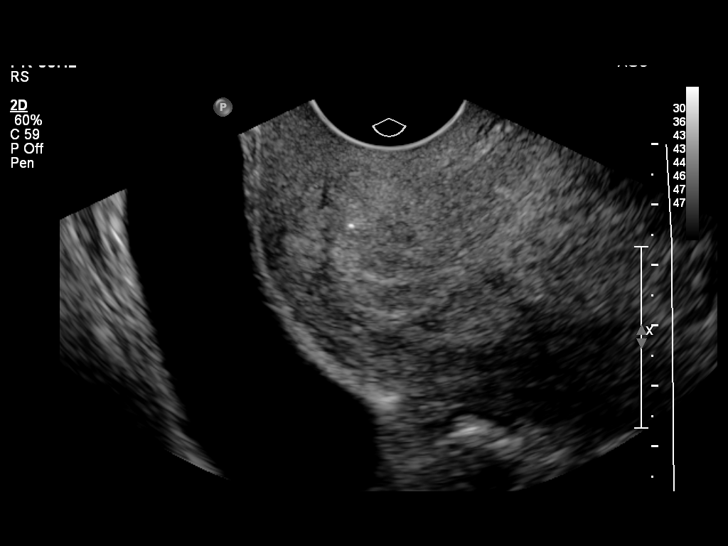
[im 27/35]
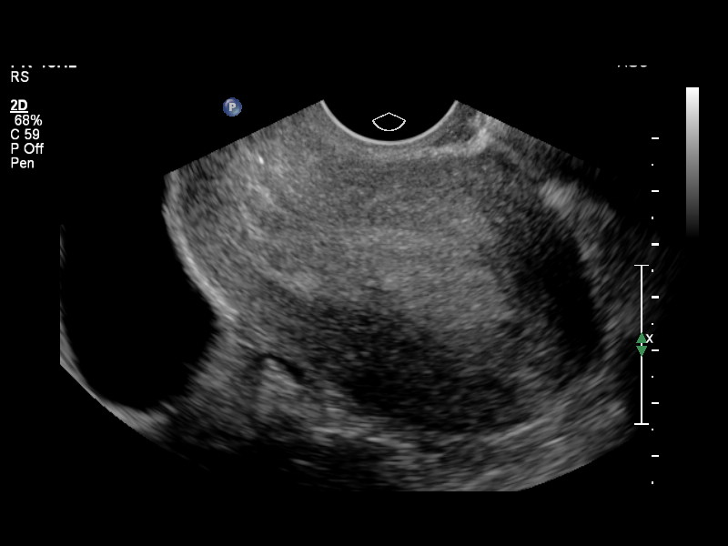
[im 29/35]
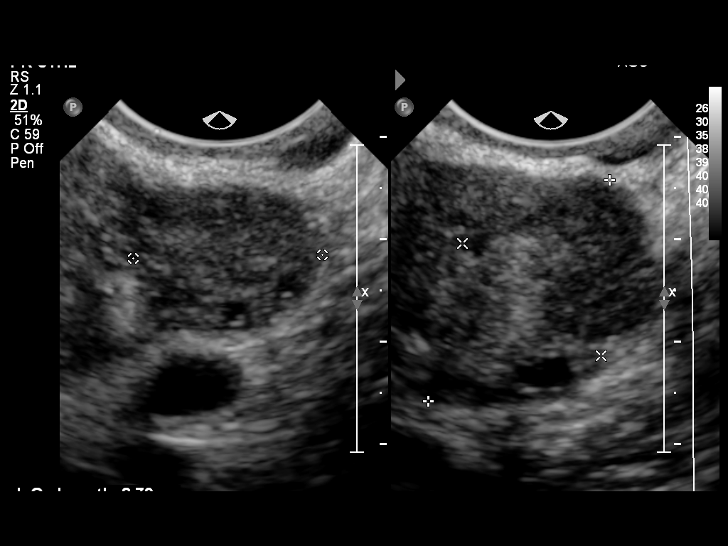
[im 32/35]
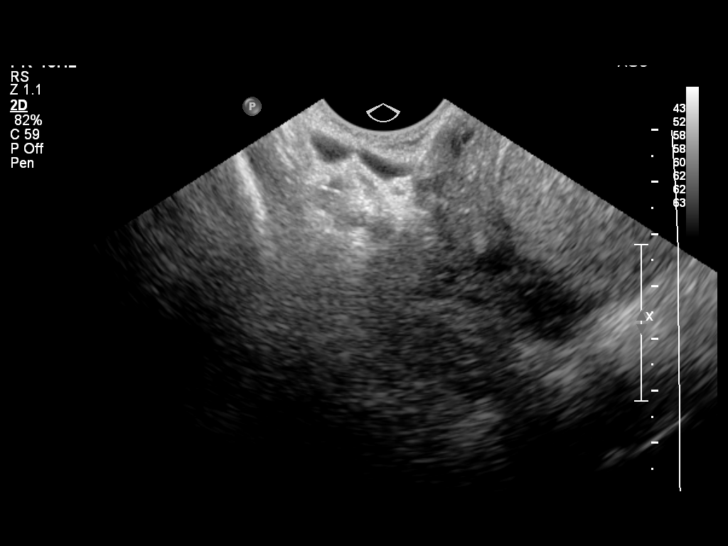
[im 35/35]
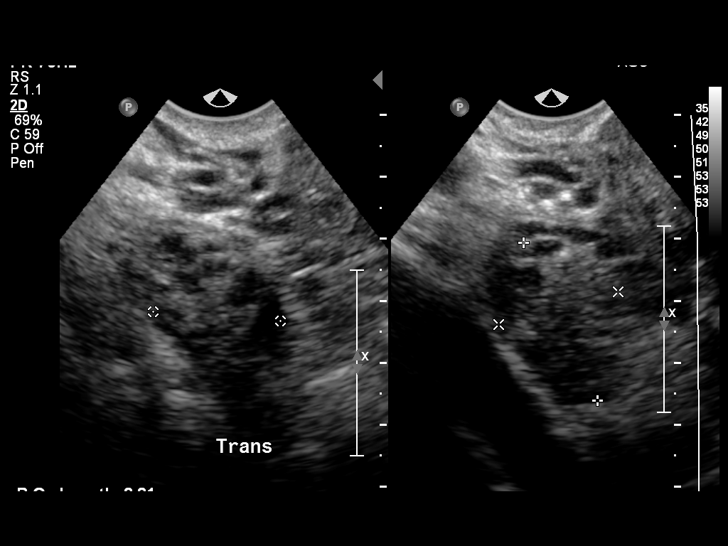

[14 of 28 positions shown; findings below may reference images not displayed]

FINDINGS: Intrauterine gestational sac: None.

Yolk sac:  None.

Embryo:  None.

Cardiac Activity: None.

Heart Rate: None detected.

Maternal uterus/adnexae: Bilateral ovaries are normal in size and
echogenicity. The right ovary measures 2.8 x 2 x 2.1 cm. The left
ovary measures 2.8 x 1.7 x 1.9 cm.
IMPRESSION: No intrauterine pregnancy identified. Serial beta HCG correlation
recommended.

## 2015-03-30 LAB — OB RESULTS CONSOLE GBS: GBS: POSITIVE

## 2015-04-11 ENCOUNTER — Inpatient Hospital Stay (HOSPITAL_COMMUNITY)
Admission: AD | Admit: 2015-04-11 | Payer: BLUE CROSS/BLUE SHIELD | Source: Ambulatory Visit | Admitting: Obstetrics and Gynecology

## 2015-04-25 NOTE — H&P (Signed)
Franki CabotLeslie J Bluestein  DICTATION # [id number] CSN# [id number]   Meriel PicaHOLLAND,Raahim Shartzer M, MD 04/25/2015 4:22 PM

## 2015-04-26 ENCOUNTER — Encounter (HOSPITAL_COMMUNITY): Payer: Self-pay

## 2015-04-26 ENCOUNTER — Inpatient Hospital Stay (HOSPITAL_COMMUNITY): Payer: BLUE CROSS/BLUE SHIELD | Admitting: Anesthesiology

## 2015-04-26 ENCOUNTER — Inpatient Hospital Stay (HOSPITAL_COMMUNITY)
Admission: RE | Admit: 2015-04-26 | Discharge: 2015-04-28 | DRG: 775 | Disposition: A | Discharge status: Home / Self Care | Payer: BLUE CROSS/BLUE SHIELD | Source: Ambulatory Visit | Attending: Obstetrics and Gynecology | Admitting: Obstetrics and Gynecology

## 2015-04-26 DIAGNOSIS — Z3A39 39 weeks gestation of pregnancy: Secondary | ICD-10-CM

## 2015-04-26 DIAGNOSIS — O99214 Obesity complicating childbirth: Secondary | ICD-10-CM | POA: Diagnosis present

## 2015-04-26 DIAGNOSIS — O99824 Streptococcus B carrier state complicating childbirth: Principal | ICD-10-CM | POA: Diagnosis present

## 2015-04-26 DIAGNOSIS — Z87891 Personal history of nicotine dependence: Secondary | ICD-10-CM

## 2015-04-26 DIAGNOSIS — Z6839 Body mass index (BMI) 39.0-39.9, adult: Secondary | ICD-10-CM

## 2015-04-26 DIAGNOSIS — O09523 Supervision of elderly multigravida, third trimester: Secondary | ICD-10-CM

## 2015-04-26 DIAGNOSIS — Z3490 Encounter for supervision of normal pregnancy, unspecified, unspecified trimester: Secondary | ICD-10-CM

## 2015-04-26 HISTORY — DX: Personal history of nicotine dependence: Z87.891

## 2015-04-26 LAB — CBC
HCT: 38.5 % (ref 36.0–46.0)
Hemoglobin: 12.7 g/dL (ref 12.0–15.0)
MCH: 28.2 pg (ref 26.0–34.0)
MCHC: 33 g/dL (ref 30.0–36.0)
MCV: 85.4 fL (ref 78.0–100.0)
PLATELETS: 229 10*3/uL (ref 150–400)
RBC: 4.51 MIL/uL (ref 3.87–5.11)
RDW: 13.9 % (ref 11.5–15.5)
WBC: 10.1 10*3/uL (ref 4.0–10.5)

## 2015-04-26 MED ORDER — LIDOCAINE HCL (PF) 1 % IJ SOLN
30.0000 mL | INTRAMUSCULAR | Status: DC | PRN
  Filled 2015-04-26: qty 30

## 2015-04-26 MED ORDER — DIBUCAINE 1 % RE OINT: 1.0000 "application " | TOPICAL_OINTMENT | RECTAL | Status: DC | PRN

## 2015-04-26 MED ORDER — TERBUTALINE SULFATE 1 MG/ML IJ SOLN
0.2500 mg | Freq: Once | INTRAMUSCULAR | Status: DC | PRN
  Filled 2015-04-26: qty 1

## 2015-04-26 MED ORDER — OXYCODONE-ACETAMINOPHEN 5-325 MG PO TABS: 1.0000 | ORAL_TABLET | ORAL | Status: DC | PRN

## 2015-04-26 MED ORDER — ACETAMINOPHEN 325 MG PO TABS: 650.0000 mg | ORAL_TABLET | ORAL | Status: DC | PRN

## 2015-04-26 MED ORDER — EPHEDRINE 5 MG/ML INJ
10.0000 mg | INTRAVENOUS | Status: DC | PRN
  Filled 2015-04-26: qty 2

## 2015-04-26 MED ORDER — LACTATED RINGERS IV SOLN
INTRAVENOUS | Status: DC
  Administered 2015-04-26 (×2): via INTRAVENOUS

## 2015-04-26 MED ORDER — OXYTOCIN BOLUS FROM INFUSION: 500.0000 mL | INTRAVENOUS | Status: DC

## 2015-04-26 MED ORDER — OXYCODONE-ACETAMINOPHEN 5-325 MG PO TABS: 2.0000 | ORAL_TABLET | ORAL | Status: DC | PRN

## 2015-04-26 MED ORDER — DIPHENHYDRAMINE HCL 25 MG PO CAPS: 25.0000 mg | ORAL_CAPSULE | Freq: Four times a day (QID) | ORAL | Status: DC | PRN

## 2015-04-26 MED ORDER — SENNOSIDES-DOCUSATE SODIUM 8.6-50 MG PO TABS
2.0000 | ORAL_TABLET | ORAL | Status: DC
  Administered 2015-04-26: 2 via ORAL
  Filled 2015-04-26 (×2): qty 2

## 2015-04-26 MED ORDER — IBUPROFEN 800 MG PO TABS
800.0000 mg | ORAL_TABLET | Freq: Three times a day (TID) | ORAL | Status: DC | PRN
  Administered 2015-04-26 – 2015-04-27 (×2): 800 mg via ORAL
  Filled 2015-04-26 (×3): qty 1

## 2015-04-26 MED ORDER — PENICILLIN G POTASSIUM 5000000 UNITS IJ SOLR
2.5000 10*6.[IU] | INTRAVENOUS | Status: DC
  Administered 2015-04-26: 2.5 10*6.[IU] via INTRAVENOUS
  Filled 2015-04-26 (×5): qty 2.5

## 2015-04-26 MED ORDER — ONDANSETRON HCL 4 MG/2ML IJ SOLN: 4.0000 mg | Freq: Four times a day (QID) | INTRAMUSCULAR | Status: DC | PRN

## 2015-04-26 MED ORDER — ACETAMINOPHEN 325 MG PO TABS
650.0000 mg | ORAL_TABLET | ORAL | Status: DC | PRN
  Administered 2015-04-27: 650 mg via ORAL
  Filled 2015-04-26: qty 2

## 2015-04-26 MED ORDER — ZOLPIDEM TARTRATE 5 MG PO TABS: 5.0000 mg | ORAL_TABLET | Freq: Every evening | ORAL | Status: DC | PRN

## 2015-04-26 MED ORDER — DIPHENHYDRAMINE HCL 50 MG/ML IJ SOLN: 12.5000 mg | INTRAMUSCULAR | Status: DC | PRN

## 2015-04-26 MED ORDER — FENTANYL 2.5 MCG/ML BUPIVACAINE 1/10 % EPIDURAL INFUSION (WH - ANES)
14.0000 mL/h | INTRAMUSCULAR | Status: DC | PRN
  Administered 2015-04-26: 14 mL/h via EPIDURAL
  Filled 2015-04-26: qty 125

## 2015-04-26 MED ORDER — OXYTOCIN 40 UNITS IN LACTATED RINGERS INFUSION - SIMPLE MED
1.0000 m[IU]/min | INTRAVENOUS | Status: DC
  Administered 2015-04-26: 2 m[IU]/min via INTRAVENOUS
  Administered 2015-04-26: 4 m[IU]/min via INTRAVENOUS

## 2015-04-26 MED ORDER — FLEET ENEMA 7-19 GM/118ML RE ENEM: 1.0000 | ENEMA | RECTAL | Status: DC | PRN

## 2015-04-26 MED ORDER — BISACODYL 10 MG RE SUPP: 10.0000 mg | Freq: Every day | RECTAL | Status: DC | PRN

## 2015-04-26 MED ORDER — PRENATAL MULTIVITAMIN CH
1.0000 | ORAL_TABLET | Freq: Every day | ORAL | Status: DC
  Filled 2015-04-26: qty 1

## 2015-04-26 MED ORDER — PHENYLEPHRINE 40 MCG/ML (10ML) SYRINGE FOR IV PUSH (FOR BLOOD PRESSURE SUPPORT)
80.0000 ug | PREFILLED_SYRINGE | INTRAVENOUS | Status: DC | PRN
  Administered 2015-04-26 (×2): 80 ug via INTRAVENOUS
  Filled 2015-04-26: qty 20
  Filled 2015-04-26: qty 2

## 2015-04-26 MED ORDER — ONDANSETRON HCL 4 MG PO TABS: 4.0000 mg | ORAL_TABLET | ORAL | Status: DC | PRN

## 2015-04-26 MED ORDER — PENICILLIN G POTASSIUM 5000000 UNITS IJ SOLR
5.0000 10*6.[IU] | Freq: Once | INTRAVENOUS | Status: AC
  Administered 2015-04-26: 5 10*6.[IU] via INTRAVENOUS
  Filled 2015-04-26: qty 5

## 2015-04-26 MED ORDER — LIDOCAINE HCL (PF) 1 % IJ SOLN
INTRAMUSCULAR | Status: DC | PRN
  Administered 2015-04-26 (×2): 4 mL

## 2015-04-26 MED ORDER — WITCH HAZEL-GLYCERIN EX PADS: 1.0000 "application " | MEDICATED_PAD | CUTANEOUS | Status: DC | PRN

## 2015-04-26 MED ORDER — ONDANSETRON HCL 4 MG/2ML IJ SOLN: 4.0000 mg | INTRAMUSCULAR | Status: DC | PRN

## 2015-04-26 MED ORDER — OXYTOCIN 40 UNITS IN LACTATED RINGERS INFUSION - SIMPLE MED
62.5000 mL/h | INTRAVENOUS | Status: DC
  Filled 2015-04-26: qty 1000

## 2015-04-26 MED ORDER — BENZOCAINE-MENTHOL 20-0.5 % EX AERO
1.0000 "application " | INHALATION_SPRAY | CUTANEOUS | Status: DC | PRN
  Administered 2015-04-27: 1 via TOPICAL
  Filled 2015-04-26: qty 56

## 2015-04-26 MED ORDER — FLEET ENEMA 7-19 GM/118ML RE ENEM: 1.0000 | ENEMA | Freq: Every day | RECTAL | Status: DC | PRN

## 2015-04-26 MED ORDER — SIMETHICONE 80 MG PO CHEW: 80.0000 mg | CHEWABLE_TABLET | ORAL | Status: DC | PRN

## 2015-04-26 MED ORDER — LACTATED RINGERS IV SOLN
500.0000 mL | INTRAVENOUS | Status: DC | PRN
  Administered 2015-04-26: 200 mL via INTRAVENOUS
  Administered 2015-04-26: 500 mL via INTRAVENOUS

## 2015-04-26 MED ORDER — TETANUS-DIPHTH-ACELL PERTUSSIS 5-2.5-18.5 LF-MCG/0.5 IM SUSP: 0.5000 mL | Freq: Once | INTRAMUSCULAR | Status: DC

## 2015-04-26 MED ORDER — LANOLIN HYDROUS EX OINT: TOPICAL_OINTMENT | CUTANEOUS | Status: DC | PRN

## 2015-04-26 MED ORDER — MEASLES, MUMPS & RUBELLA VAC ~~LOC~~ INJ: 0.5000 mL | INJECTION | Freq: Once | SUBCUTANEOUS | Status: DC

## 2015-04-26 MED ORDER — CITRIC ACID-SODIUM CITRATE 334-500 MG/5ML PO SOLN: 30.0000 mL | ORAL | Status: DC | PRN

## 2015-04-26 NOTE — H&P (Signed)
NAMSilvestre [name]:  [name], [name]             ACCOUNT NO.:  [id number]  MEDICAL RECORD NO.:  [id number]  LOCATION:  9170                          FACILITY:  [facility]  PHYSICIAN:  [name] [name], M.D.DATE OF BIRTH:  15-Feb-1976  DATE OF ADMISSION:  04/26/2015 DATE OF DISCHARGE:                             HISTORY & PHYSICAL   CHIEF COMPLAINT:  For induction of labor at term.  HISTORY OF PRESENT ILLNESS:  A 39 year old, G4, P2-0-1-2, history of positive GBS, EDD is December 29, presents for labor induction at term with a favorable cervix.  Her 1 hour GTT was 127.  Blood type is O negative.  She did receive RhoGAM.  She is also interested in postpartum tubal, which we discussed.  Two prior deliveries at term.  PAST MEDICAL HISTORY:  Please see the [facility] form for details.  PHYSICAL EXAM:  VITAL SIGNS:  Temp 98.2, blood pressure 122/80. HEENT:  Unremarkable. NECK:  Supple without masses. LUNGS:  Clear. CARDIOVASCULAR:  Regular rate and rhythm without murmurs, rubs, gallops. BREASTS:  Not examined. Term fundal height.  Fetal heart rate 135.  Cervix was 2, 30% vertex, - 2. EXTREMITIES/NEUROLOGIC:  Unremarkable.  IMPRESSION:  Term pregnancy, positive GBS, favorable cervix for induction at term.  History of positive GBS for penicillin prophylaxis.     [name] [name], M.D.     RM[facility]/MEDQ  D:  04/25/2015  T:  04/25/2015  Job:  [id number]

## 2015-04-26 NOTE — Progress Notes (Signed)
2/30/post>>>ISE for AROM>>clear, will start PCN

## 2015-04-26 NOTE — Anesthesia Preprocedure Evaluation (Signed)
Anesthesia Evaluation  Patient identified by MRN, date of birth, ID band Patient awake    Reviewed: Allergy & Precautions, NPO status , Patient's Chart, lab work & pertinent test results  History of Anesthesia Complications Negative for: history of anesthetic complications  Airway Mallampati: II  TM Distance: >3 FB Neck ROM: Full    Dental no notable dental hx. (+) Dental Advisory Given   Pulmonary former smoker,    Pulmonary exam normal breath sounds clear to auscultation       Cardiovascular negative cardio ROS Normal cardiovascular exam Rhythm:Regular Rate:Normal     Neuro/Psych negative neurological ROS  negative psych ROS   GI/Hepatic negative GI ROS, Neg liver ROS,   Endo/Other  Morbid obesity  Renal/GU negative Renal ROS  negative genitourinary   Musculoskeletal negative musculoskeletal ROS (+)   Abdominal   Peds negative pediatric ROS (+)  Hematology negative hematology ROS (+)   Anesthesia Other Findings   Reproductive/Obstetrics (+) Pregnancy                             Anesthesia Physical Anesthesia Plan  ASA: III  Anesthesia Plan: Epidural   Post-op Pain Management:    Induction:   Airway Management Planned:   Additional Equipment:   Intra-op Plan:   Post-operative Plan:   Informed Consent: I have reviewed the patients History and Physical, chart, labs and discussed the procedure including the risks, benefits and alternatives for the proposed anesthesia with the patient or authorized representative who has indicated his/her understanding and acceptance.   Dental advisory given  Plan Discussed with: CRNA  Anesthesia Plan Comments:         Anesthesia Quick Evaluation

## 2015-04-26 NOTE — Progress Notes (Signed)
Delivery of live viable female by Dr Holland. APGARS 9,9 

## 2015-04-26 NOTE — Anesthesia Procedure Notes (Signed)
Epidural Patient location during procedure: OB  Staffing Anesthesiologist: Rashaun Curl Performed by: anesthesiologist   Preanesthetic Checklist Completed: patient identified, site marked, surgical consent, pre-op evaluation, timeout performed, IV checked, risks and benefits discussed and monitors and equipment checked  Epidural Patient position: sitting Prep: site prepped and draped and DuraPrep Patient monitoring: continuous pulse ox and blood pressure Approach: midline Location: L3-L4 Injection technique: LOR saline  Needle:  Needle type: Tuohy  Needle gauge: 17 G Needle length: 9 cm and 9 Needle insertion depth: 7 cm Catheter type: closed end flexible Catheter size: 19 Gauge Catheter at skin depth: 12 cm Test dose: negative  Assessment Events: blood not aspirated, injection not painful, no injection resistance, negative IV test and no paresthesia  Additional Notes Patient identified. Risks/Benefits/Options discussed with patient including but not limited to bleeding, infection, nerve damage, paralysis, failed block, incomplete pain control, headache, blood pressure changes, nausea, vomiting, reactions to medication both or allergic, itching and postpartum back pain. Confirmed with bedside nurse the patient's most recent platelet count. Confirmed with patient that they are not currently taking any anticoagulation, have any bleeding history or any family history of bleeding disorders. Patient expressed understanding and wished to proceed. All questions were answered. Sterile technique was used throughout the entire procedure. Please see nursing notes for vital signs. Test dose was given through epidural catheter and negative prior to continuing to dose epidural or start infusion. Warning signs of high block given to the patient including shortness of breath, tingling/numbness in hands, complete motor block, or any concerning symptoms with instructions to call for help. Patient was  given instructions on fall risk and not to get out of bed. All questions and concerns addressed with instructions to call with any issues or inadequate analgesia.      

## 2015-04-27 ENCOUNTER — Inpatient Hospital Stay (HOSPITAL_COMMUNITY): Payer: BLUE CROSS/BLUE SHIELD | Admitting: Anesthesiology

## 2015-04-27 ENCOUNTER — Encounter (HOSPITAL_COMMUNITY)
Admission: RE | Disposition: A | Discharge status: Home / Self Care | Payer: Self-pay | Source: Ambulatory Visit | Attending: Obstetrics and Gynecology

## 2015-04-27 LAB — CBC
HCT: 33.8 % — ABNORMAL LOW (ref 36.0–46.0)
Hemoglobin: 11 g/dL — ABNORMAL LOW (ref 12.0–15.0)
MCH: 27.9 pg (ref 26.0–34.0)
MCHC: 32.5 g/dL (ref 30.0–36.0)
MCV: 85.8 fL (ref 78.0–100.0)
PLATELETS: 195 10*3/uL (ref 150–400)
RBC: 3.94 MIL/uL (ref 3.87–5.11)
RDW: 14 % (ref 11.5–15.5)
WBC: 13.1 10*3/uL — AB (ref 4.0–10.5)

## 2015-04-27 LAB — TYPE AND SCREEN
ABO/RH(D): O NEG
Antibody Screen: POSITIVE
DAT, IGG: NEGATIVE
Unit division: 0
Unit division: 0

## 2015-04-27 LAB — RPR: RPR: NONREACTIVE

## 2015-04-27 SURGERY — LIGATION, FALLOPIAN TUBE, POSTPARTUM
Anesthesia: Epidural | Laterality: Bilateral

## 2015-04-27 MED ORDER — PHENYLEPHRINE 40 MCG/ML (10ML) SYRINGE FOR IV PUSH (FOR BLOOD PRESSURE SUPPORT)
PREFILLED_SYRINGE | INTRAVENOUS | Status: AC
  Filled 2015-04-27: qty 10

## 2015-04-27 MED ORDER — SODIUM BICARBONATE 8.4 % IV SOLN
INTRAVENOUS | Status: DC | PRN
  Administered 2015-04-27 (×4): 5 mL via EPIDURAL

## 2015-04-27 MED ORDER — PRENATAL MULTIVITAMIN CH
1.0000 | ORAL_TABLET | Freq: Every day | ORAL | Status: DC
  Administered 2015-04-27 – 2015-04-28 (×2): 1 via ORAL
  Filled 2015-04-27: qty 1

## 2015-04-27 MED ORDER — DIPHENHYDRAMINE HCL 25 MG PO CAPS: 25.0000 mg | ORAL_CAPSULE | Freq: Four times a day (QID) | ORAL | Status: DC | PRN

## 2015-04-27 MED ORDER — OXYCODONE-ACETAMINOPHEN 5-325 MG PO TABS: 2.0000 | ORAL_TABLET | ORAL | Status: DC | PRN

## 2015-04-27 MED ORDER — BISACODYL 10 MG RE SUPP: 10.0000 mg | Freq: Every day | RECTAL | Status: DC | PRN

## 2015-04-27 MED ORDER — MIDAZOLAM HCL 2 MG/2ML IJ SOLN
INTRAMUSCULAR | Status: AC
  Filled 2015-04-27: qty 2

## 2015-04-27 MED ORDER — LACTATED RINGERS IV SOLN
INTRAVENOUS | Status: DC | PRN
  Administered 2015-04-27: 09:00:00 via INTRAVENOUS

## 2015-04-27 MED ORDER — FENTANYL CITRATE (PF) 100 MCG/2ML IJ SOLN
100.0000 ug | Freq: Once | INTRAMUSCULAR | Status: AC
  Administered 2015-04-27: 100 ug via INTRAVENOUS

## 2015-04-27 MED ORDER — MIDAZOLAM HCL 2 MG/2ML IJ SOLN
1.0000 mg | Freq: Once | INTRAMUSCULAR | Status: AC
  Administered 2015-04-27: 1 mg via INTRAVENOUS

## 2015-04-27 MED ORDER — LIDOCAINE-EPINEPHRINE (PF) 2 %-1:200000 IJ SOLN
INTRAMUSCULAR | Status: AC
  Filled 2015-04-27: qty 20

## 2015-04-27 MED ORDER — BENZOCAINE-MENTHOL 20-0.5 % EX AERO
1.0000 | INHALATION_SPRAY | CUTANEOUS | Status: DC | PRN
Start: 2015-04-27 — End: 2015-04-28

## 2015-04-27 MED ORDER — MEASLES, MUMPS & RUBELLA VAC ~~LOC~~ INJ
0.5000 mL | INJECTION | Freq: Once | SUBCUTANEOUS | Status: DC
  Filled 2015-04-27: qty 0.5

## 2015-04-27 MED ORDER — PHENYLEPHRINE 40 MCG/ML (10ML) SYRINGE FOR IV PUSH (FOR BLOOD PRESSURE SUPPORT)
PREFILLED_SYRINGE | INTRAVENOUS | Status: AC
  Filled 2015-04-27: qty 20

## 2015-04-27 MED ORDER — FENTANYL CITRATE (PF) 100 MCG/2ML IJ SOLN: 25.0000 ug | INTRAMUSCULAR | Status: DC | PRN

## 2015-04-27 MED ORDER — ACETAMINOPHEN 325 MG PO TABS
650.0000 mg | ORAL_TABLET | ORAL | Status: DC | PRN
  Administered 2015-04-27: 650 mg via ORAL
  Filled 2015-04-27: qty 2

## 2015-04-27 MED ORDER — WITCH HAZEL-GLYCERIN EX PADS
1.0000 "application " | MEDICATED_PAD | CUTANEOUS | Status: DC | PRN
  Administered 2015-04-27: 1 via TOPICAL

## 2015-04-27 MED ORDER — FENTANYL CITRATE (PF) 100 MCG/2ML IJ SOLN
INTRAMUSCULAR | Status: AC
  Filled 2015-04-27: qty 2

## 2015-04-27 MED ORDER — LANOLIN HYDROUS EX OINT: TOPICAL_OINTMENT | CUTANEOUS | Status: DC | PRN

## 2015-04-27 MED ORDER — FENTANYL CITRATE (PF) 100 MCG/2ML IJ SOLN
INTRAMUSCULAR | Status: DC | PRN
  Administered 2015-04-27: 50 ug via INTRAVENOUS

## 2015-04-27 MED ORDER — EPHEDRINE 5 MG/ML INJ
INTRAVENOUS | Status: AC
  Filled 2015-04-27: qty 10

## 2015-04-27 MED ORDER — SENNOSIDES-DOCUSATE SODIUM 8.6-50 MG PO TABS
2.0000 | ORAL_TABLET | ORAL | Status: DC
  Administered 2015-04-28: 2 via ORAL
  Filled 2015-04-27: qty 2

## 2015-04-27 MED ORDER — ZOLPIDEM TARTRATE 5 MG PO TABS: 5.0000 mg | ORAL_TABLET | Freq: Every evening | ORAL | Status: DC | PRN

## 2015-04-27 MED ORDER — DIBUCAINE 1 % RE OINT: 1.0000 "application " | TOPICAL_OINTMENT | RECTAL | Status: DC | PRN

## 2015-04-27 MED ORDER — BUPIVACAINE HCL (PF) 0.25 % IJ SOLN
INTRAMUSCULAR | Status: AC
Start: 2015-04-27 — End: 2015-04-27
  Filled 2015-04-27: qty 30

## 2015-04-27 MED ORDER — METOCLOPRAMIDE HCL 10 MG PO TABS
10.0000 mg | ORAL_TABLET | Freq: Once | ORAL | Status: AC
  Administered 2015-04-27: 10 mg via ORAL
  Filled 2015-04-27: qty 1

## 2015-04-27 MED ORDER — ONDANSETRON HCL 4 MG PO TABS: 4.0000 mg | ORAL_TABLET | ORAL | Status: DC | PRN

## 2015-04-27 MED ORDER — ONDANSETRON HCL 4 MG/2ML IJ SOLN
INTRAMUSCULAR | Status: AC
  Filled 2015-04-27: qty 2

## 2015-04-27 MED ORDER — PHENYLEPHRINE HCL 10 MG/ML IJ SOLN
INTRAMUSCULAR | Status: DC | PRN
  Administered 2015-04-27: 80 ug via INTRAVENOUS
  Administered 2015-04-27: 40 ug via INTRAVENOUS
  Administered 2015-04-27 (×4): 80 ug via INTRAVENOUS

## 2015-04-27 MED ORDER — ONDANSETRON HCL 4 MG/2ML IJ SOLN
INTRAMUSCULAR | Status: DC | PRN
  Administered 2015-04-27: 4 mg via INTRAVENOUS

## 2015-04-27 MED ORDER — TETANUS-DIPHTH-ACELL PERTUSSIS 5-2.5-18.5 LF-MCG/0.5 IM SUSP: 0.5000 mL | Freq: Once | INTRAMUSCULAR | Status: DC

## 2015-04-27 MED ORDER — CHLORHEXIDINE GLUCONATE CLOTH 2 % EX PADS: 6.0000 | MEDICATED_PAD | Freq: Every day | CUTANEOUS | Status: DC

## 2015-04-27 MED ORDER — GLYCOPYRROLATE 0.2 MG/ML IJ SOLN
INTRAMUSCULAR | Status: AC
  Filled 2015-04-27: qty 1

## 2015-04-27 MED ORDER — IBUPROFEN 800 MG PO TABS
800.0000 mg | ORAL_TABLET | Freq: Three times a day (TID) | ORAL | Status: DC | PRN
  Administered 2015-04-27 – 2015-04-28 (×3): 800 mg via ORAL
  Filled 2015-04-27 (×3): qty 1

## 2015-04-27 MED ORDER — ONDANSETRON HCL 4 MG/2ML IJ SOLN: 4.0000 mg | INTRAMUSCULAR | Status: DC | PRN

## 2015-04-27 MED ORDER — OXYCODONE-ACETAMINOPHEN 5-325 MG PO TABS: 1.0000 | ORAL_TABLET | ORAL | Status: DC | PRN

## 2015-04-27 MED ORDER — PROMETHAZINE HCL 25 MG/ML IJ SOLN: 6.2500 mg | INTRAMUSCULAR | Status: DC | PRN

## 2015-04-27 MED ORDER — SODIUM BICARBONATE 8.4 % IV SOLN
INTRAVENOUS | Status: AC
  Filled 2015-04-27: qty 50

## 2015-04-27 MED ORDER — MUPIROCIN 2 % EX OINT
1.0000 "application " | TOPICAL_OINTMENT | Freq: Two times a day (BID) | CUTANEOUS | Status: DC
  Administered 2015-04-27: 1 via NASAL
  Filled 2015-04-27: qty 22

## 2015-04-27 MED ORDER — MIDAZOLAM HCL 5 MG/5ML IJ SOLN
INTRAMUSCULAR | Status: DC | PRN
  Administered 2015-04-27 (×2): 2 mg via INTRAVENOUS

## 2015-04-27 MED ORDER — FAMOTIDINE 20 MG PO TABS
40.0000 mg | ORAL_TABLET | Freq: Once | ORAL | Status: AC
  Administered 2015-04-27: 40 mg via ORAL
  Filled 2015-04-27: qty 2

## 2015-04-27 MED ORDER — FLEET ENEMA 7-19 GM/118ML RE ENEM: 1.0000 | ENEMA | Freq: Every day | RECTAL | Status: DC | PRN

## 2015-04-27 MED ORDER — SIMETHICONE 80 MG PO CHEW: 80.0000 mg | CHEWABLE_TABLET | ORAL | Status: DC | PRN

## 2015-04-27 MED ORDER — LACTATED RINGERS IV SOLN
INTRAVENOUS | Status: DC
  Administered 2015-04-27 (×2): via INTRAVENOUS
  Administered 2015-04-27: 20 mL/h via INTRAVENOUS

## 2015-04-27 SURGICAL SUPPLY — 20 items
CLOTH BEACON ORANGE TIMEOUT ST (SAFETY) ×3 IMPLANT
DRSG OPSITE POSTOP 3X4 (GAUZE/BANDAGES/DRESSINGS) ×3 IMPLANT
ELECT REM PT RETURN 9FT ADLT (ELECTROSURGICAL) ×3
ELECTRODE REM PT RTRN 9FT ADLT (ELECTROSURGICAL) ×1 IMPLANT
GLOVE BIO SURGEON STRL SZ7 (GLOVE) ×3 IMPLANT
GLOVE BIOGEL PI IND STRL 7.0 (GLOVE) ×1 IMPLANT
GLOVE BIOGEL PI INDICATOR 7.0 (GLOVE) ×2
GOWN STRL REUS W/TWL LRG LVL3 (GOWN DISPOSABLE) ×6 IMPLANT
NEEDLE HYPO 22GX1.5 SAFETY (NEEDLE) ×3 IMPLANT
NS IRRIG 1000ML POUR BTL (IV SOLUTION) ×3 IMPLANT
PACK ABDOMINAL MINOR (CUSTOM PROCEDURE TRAY) ×3 IMPLANT
PENCIL BUTTON HOLSTER BLD 10FT (ELECTRODE) ×3 IMPLANT
SPONGE LAP 4X18 X RAY DECT (DISPOSABLE) ×3 IMPLANT
SUT VIC AB 2-0 UR6 27 (SUTURE) ×3 IMPLANT
SUT VICRYL 4-0 PS2 18IN ABS (SUTURE) ×3 IMPLANT
SYR BULB 3OZ (MISCELLANEOUS) IMPLANT
SYR CONTROL 10ML LL (SYRINGE) ×3 IMPLANT
TOWEL OR 17X24 6PK STRL BLUE (TOWEL DISPOSABLE) ×6 IMPLANT
TRAY FOLEY CATH SILVER 14FR (SET/KITS/TRAYS/PACK) ×3 IMPLANT
WATER STERILE IRR 1000ML POUR (IV SOLUTION) ×3 IMPLANT

## 2015-04-27 NOTE — Anesthesia Postprocedure Evaluation (Signed)
Anesthesia Post Note  Patient: [name] [name]  Procedure(s) Performed: * No procedures listed *  Patient location during evaluation: Mother Baby Anesthesia Type: Epidural Level of consciousness: awake and alert and oriented Pain management: pain level controlled Vital Signs Assessment: post-procedure vital signs reviewed and stable Respiratory status: spontaneous breathing and nonlabored ventilation Cardiovascular status: stable Postop Assessment: no headache, no backache, patient able to bend at knees, no signs of nausea or vomiting and adequate PO intake Anesthetic complications: no    Last Vitals:  Filed Vitals:   04/26/15 2346 04/27/15 0519  BP: 137/81 134/72  Pulse: 91 66  Temp: 36.8 C 36.7 C  Resp: 20 18    Last Pain:  Filed Vitals:   04/27/15 0602  PainSc: Asleep                 Teniya Filter

## 2015-04-27 NOTE — Progress Notes (Signed)
The patient was re-examined with no change in status 

## 2015-04-27 NOTE — Progress Notes (Signed)
Pt had epidural dosed in OR and then had second thoughts and demanded to cancel procedure>>>will resume normal PP orders

## 2015-04-27 NOTE — Anesthesia Preprocedure Evaluation (Addendum)
Anesthesia Evaluation  Patient identified by MRN, date of birth, ID band Patient awake    Reviewed: Allergy & Precautions, NPO status , Patient's Chart, lab work & pertinent test results  History of Anesthesia Complications Negative for: history of anesthetic complications  Airway Mallampati: II  TM Distance: >3 FB Neck ROM: Full    Dental no notable dental hx. (+) Dental Advisory Given   Pulmonary former smoker,    Pulmonary exam normal breath sounds clear to auscultation       Cardiovascular negative cardio ROS Normal cardiovascular exam Rhythm:Regular Rate:Normal     Neuro/Psych negative neurological ROS  negative psych ROS   GI/Hepatic negative GI ROS, Neg liver ROS,   Endo/Other  Morbid obesity  Renal/GU negative Renal ROS  negative genitourinary   Musculoskeletal negative musculoskeletal ROS (+)   Abdominal   Peds negative pediatric ROS (+)  Hematology negative hematology ROS (+)   Anesthesia Other Findings Day of surgery medications reviewed with the patient.  Reproductive/Obstetrics negative OB ROS                            Anesthesia Physical Anesthesia Plan  ASA: III  Anesthesia Plan: Epidural   Post-op Pain Management:    Induction:   Airway Management Planned: Natural Airway  Additional Equipment:   Intra-op Plan:   Post-operative Plan:   Informed Consent: I have reviewed the patients History and Physical, chart, labs and discussed the procedure including the risks, benefits and alternatives for the proposed anesthesia with the patient or authorized representative who has indicated his/her understanding and acceptance.   Dental advisory given  Plan Discussed with: CRNA  Anesthesia Plan Comments: (Plan to use in situ epidural.  If epidural not working, will proceed with spinal anesthesia.  Patient agrees to plan.  **CASE CANCELLED IN OR prior to start due  to patient request.**)       Anesthesia Quick Evaluation

## 2015-04-27 NOTE — Transfer of Care (Signed)
Immediate Anesthesia Transfer of Care Note  Patient: [name] [name]  Procedure(s) Performed: * No procedures listed *  Patient Location: PACU  Anesthesia Type:Epidural  Level of Consciousness: awake, alert  and oriented  Airway & Oxygen Therapy: Patient Spontanous Breathing  Post-op Assessment: Report given to RN and Post -op Vital signs reviewed and unstable, Anesthesiologist notified  Post vital signs: Reviewed  Last Vitals:  100/45-65-11  Complications: No apparent anesthesia complications

## 2015-04-27 NOTE — Anesthesia Postprocedure Evaluation (Signed)
Anesthesia Post Note  Patient: Franki CabotLeslie J Stauder  Procedure(s) Performed: * No procedures listed *  Patient location during evaluation: PACU Anesthesia Type: Epidural Level of consciousness: awake, awake and alert, oriented and patient cooperative Pain management: pain level controlled Vital Signs Assessment: post-procedure vital signs reviewed and stable Respiratory status: spontaneous breathing, nonlabored ventilation and respiratory function stable Cardiovascular status: stable Postop Assessment: no headache, no backache, epidural receding, patient able to bend at knees and no signs of nausea or vomiting (Epidural catheter removed in PACU) Anesthetic complications: no Comments: CASE CANCELLED IN OR BY PATIENT REQUEST.  Epidural had been dosed, so patient transported to PACU for monitoring before discharge back to floor.    Last Vitals:  Filed Vitals:   04/27/15 1100 04/27/15 1115  BP: 105/66 98/45  Pulse: 63 67  Temp:    Resp: 10 14    Last Pain:  Filed Vitals:   04/27/15 1129  PainSc: 6     LLE Motor Response: Purposeful movement (04/27/15 1130) LLE Sensation: Tingling;Numbness (04/27/15 1130) RLE Motor Response: Purposeful movement (04/27/15 1130) RLE Sensation: Tingling;Numbness (04/27/15 1130)      Cecile HearingStephen Edward Turk

## 2015-04-27 NOTE — OR Nursing (Signed)
Ready to prep patients abdomen for surgery.  Patient states she is having an anxiety attack and says she "cannot do this".   Patient states she does not wish to have procedure.  Abdomen not prepped.  [facility]  Procedure cancelled per Dr. Marcelle OverlieHolland.  Patient transported to PACU

## 2015-04-27 NOTE — Plan of Care (Signed)
Problem: Education: Goal: Knowledge of condition will improve Outcome: Progressing Pre-op teaching complete for Bilateral Tubal Ligation, including Incentive spirometer, SCD's,CHG bath,muciprin ointment and time frame for return to room. Patient verbalizes understanding.

## 2015-04-27 NOTE — Progress Notes (Signed)
RN-Yamel Bale in preparing patient for surgery mis read the preop surgical swab testing. I labeled it n/a. The next heading or item in the pre-op check list reads "pre-op surgical treatment for all areas".(requiring mupirocin). RN thought mupirocin was required for all areas and gave nasally to patient.This order set generated a contact isolation flag,inadvertantly for this patient.She did not have a positive PCR for MRSA.Physician discontinued the contact precautions when  Pre-op orders were resumed.

## 2015-04-27 NOTE — Progress Notes (Signed)
Post Partum Day one Subjective: no complaints  Objective: Blood pressure 134/72, pulse 66, temperature 98.1 F (36.7 C), temperature source Oral, resp. rate 18, height 5\' 6"  (1.676 m), weight 243 lb (110.224 kg), last menstrual period 07/21/2014, SpO2 99 %, unknown if currently breastfeeding.  Physical Exam:  General: alert Lochia: appropriate Uterine Fundus: firm Incision: na DVT Evaluation: No evidence of DVT seen on physical exam.   Recent Labs  04/26/15 0915 04/27/15 0607  HGB 12.7 11.0*  HCT 38.5 33.8*    Assessment/Plan: Plan for discharge tomorrow   LOS: 1 day   Olivette Beckmann S 04/27/2015, 7:44 AM

## 2015-04-27 NOTE — Lactation Note (Signed)
This note was copied from the chart of [name] Silvestre MomentLeslie Karczewski. Lactation Consultation Note  Patient Name: [name] [name] ZOXWR'UToday's Date: 04/27/2015 Reason for consult: Initial assessment   Initial consult at 25 hrs old; GA 39.6; BW 7 lbs, 9 oz.  Mom is a P3 with 10 months breastfeeding experience with each of 2 prior children.  BLT Today. Infant has breastfed x4 (15-30 min) + attempts x5 (0-5 min) in past 24 hrs; voids-2 in 24 hrs; stools-7 in 24 hrs. Mom has large soft breast with erect nipples that dimple in the center.  Abrasions noted on both nipples in the center.   Reviewed hand expression with return demonstration and observation of colostrum.   Encouraged mom to hand express colostrum after each feeding for breast care.  Comfort gels given and explained how to use.   Infant cuing to feed upon entering room.  Assisted with latching in football hold left side.  Mom having difficulty holding position d/t carpel tunnel.   Mom had just finished pumping when LC entered room.  Collected 3 ml EBM. LC showed parents how to use curved-tip syringe at breast for EBM supplementation.  Also demonstrated finger feeding with curved-tip syringe to get infant into a sucking pattern if needed.   Infant had good oral suction at breast and pulled colostrum out of curved-tip syringe himself while latched at breast. Infant fed for 5 minutes and then went to sleep. LS-6.   Educated to continue feeding with cues, cluster feeding, and size of infant's stomach.  Encouraged to continue exclusive breastfeeding with pumping as needed for comfort and EBM supplementation as needed.  FOB was able to return demonstrate use of curved-tip syringe at breast with EBM. Lactation brochure given and informed of outpatient services and hospital support group. Encouraged parents to call RN for assistance with latching if needed.       Maternal Data Formula Feeding for Exclusion: No Has patient been taught Hand Expression?:  Yes Does the patient have breastfeeding experience prior to this delivery?: Yes  Feeding Feeding Type: Breast Fed Length of feed: 5 min  LATCH Score/Interventions Latch: Repeated attempts needed to sustain latch, nipple held in mouth throughout feeding, stimulation needed to elicit sucking reflex. Intervention(s): Teach feeding cues;Waking techniques Intervention(s): Breast compression  Audible Swallowing: A few with stimulation Intervention(s): Skin to skin Intervention(s): Hand expression  Type of Nipple: Everted at rest and after stimulation (center of nipples dimple) Intervention(s): Double electric pump  Comfort (Breast/Nipple): Filling, red/small blisters or bruises, mild/mod discomfort  Problem noted: Mild/Moderate discomfort Interventions (Mild/moderate discomfort): Comfort gels  Hold (Positioning): Assistance needed to correctly position infant at breast and maintain latch. Intervention(s): Breastfeeding basics reviewed;Support Pillows;Skin to skin  LATCH Score: 6  Lactation Tools Discussed/Used Tools: Other (comment) (curved tip syringe at breast) Breast pump type: Double-Electric Breast Pump WIC Program: No   Consult Status Consult Status: Follow-up Date: 04/28/15 Follow-up type: In-patient    [name] [name], [name] [name] 04/27/2015, 6:16 PM

## 2015-04-27 NOTE — Plan of Care (Addendum)
Problem: Coping: Goal: Ability to cope will improve Outcome: Progressing Pt reports severe claustrophobia. SS consult put in.

## 2015-04-28 LAB — CBC
HEMATOCRIT: 32.6 % — AB (ref 36.0–46.0)
HEMOGLOBIN: 10.5 g/dL — AB (ref 12.0–15.0)
MCH: 27.9 pg (ref 26.0–34.0)
MCHC: 32.2 g/dL (ref 30.0–36.0)
MCV: 86.5 fL (ref 78.0–100.0)
Platelets: 186 10*3/uL (ref 150–400)
RBC: 3.77 MIL/uL — AB (ref 3.87–5.11)
RDW: 14.2 % (ref 11.5–15.5)
WBC: 9.9 10*3/uL (ref 4.0–10.5)

## 2015-04-28 MED ORDER — IBUPROFEN 800 MG PO TABS: 800.0000 mg | ORAL_TABLET | Freq: Three times a day (TID) | ORAL | Status: DC | PRN

## 2015-04-28 MED ORDER — OXYCODONE-ACETAMINOPHEN 5-325 MG PO TABS: 1.0000 | ORAL_TABLET | ORAL | Status: DC | PRN

## 2015-04-28 NOTE — Discharge Instructions (Signed)
Call MD for T>100.4, heavy vaginal bleeding, severe abdominal pain, or respiratory distress.  Call office to schedule postpartum visit in 6 weeks.  No driving while taking narcotics.  Pelvic rest x 6 weeks.  °

## 2015-04-28 NOTE — Discharge Summary (Signed)
Obstetric Discharge Summary Reason for Admission: induction of labor Prenatal Procedures: none Intrapartum Procedures: spontaneous vaginal delivery Postpartum Procedures: none Complications-Operative and Postpartum: none HEMOGLOBIN  Date Value Ref Range Status  04/28/2015 10.5* 12.0 - 15.0 g/dL Final   HCT  Date Value Ref Range Status  04/28/2015 32.6* 36.0 - 46.0 % Final    Physical Exam:  General: alert, cooperative and appears stated age Lochia: appropriate Uterine Fundus: firm Incision: healing well, no significant drainage, no dehiscence DVT Evaluation: No evidence of DVT seen on physical exam. Negative Homan's sign. No cords or calf tenderness.  Discharge Diagnoses: Term Pregnancy-delivered  Discharge Information: Date: 04/28/2015 Activity: pelvic rest Diet: routine Medications: PNV, Ibuprofen and Percocet Condition: stable Instructions: refer to practice specific booklet Discharge to: home   Newborn Data: Live born female  Birth Weight: 7 lb 9 oz (3430 g) APGAR: 6, 10  Home with mother.  Salome Cozby 04/28/2015, 8:54 AM

## 2015-04-28 NOTE — Lactation Note (Signed)
This note was copied from the chart of [name] Silvestre MomentLeslie Alanis. Lactation Consultation Note  Patient Name: [name] [name] ZOXWR'UToday's Date: 04/28/2015 Reason for consult: Follow-up assessment  Baby 43 hours old. Mom reports that baby just circumcised and sleepy now from Tylenol. Mom is packed and ready for D/C. Mom states that she had a difficult time nursing her first child, but was able to successfully nurse her second child. Mom states that she sometimes has a hard time getting the baby on the breast with an asymmetrical latch. Discussed using football position as this would be more comfortable with mom's carpel tunnel. Enc supporting baby at base of head/neck in order for baby's head to tilt slightly back and lead onto the breast with his chin. Enc mom to support her hand with a rolled blanket after getting the baby latched. Enc mom to offer lots of STS and attempts at breast and hand express colostrum into baby's mouth until baby able to latch well. Mom states that she has DEBP at home. Mom aware of OP/BFSG and LC phone line assistance after D/C.  Maternal Data    Feeding    LATCH Score/Interventions                      Lactation Tools Discussed/Used     Consult Status Consult Status: PRN    Geralynn OchsWILLIARD, Mervil Wacker 04/28/2015, 11:40 AM

## 2015-04-28 NOTE — Progress Notes (Signed)
Post Partum Day 2 Subjective: no complaints, up ad lib, voiding and tolerating PO  Objective: Blood pressure 127/73, pulse 79, temperature 97.8 F (36.6 C), temperature source Oral, resp. rate 18, height 5\' 6"  (1.676 m), weight 110.224 kg (243 lb), last menstrual period 07/21/2014, SpO2 100 %, unknown if currently breastfeeding.  Physical Exam:  General: alert, cooperative and appears stated age Lochia: appropriate Uterine Fundus: firm Incision: healing well, no significant drainage, no dehiscence DVT Evaluation: No evidence of DVT seen on physical exam. Negative Homan's sign. No cords or calf tenderness.   Recent Labs  04/27/15 0607 04/28/15 0551  HGB 11.0* 10.5*  HCT 33.8* 32.6*    Assessment/Plan: Discharge home, Breastfeeding and Circumcision prior to discharge  Patient counseled for circ including risk of bleeding, infection and scarring.  All questions were answered and the patient wishes to proceed.   LOS: 2 days   Jubal Rademaker 04/28/2015, 8:50 AM

## 2015-04-28 NOTE — Progress Notes (Signed)
CSW met with MOB and FOB in MOB's first floor room/139 to complete assessment for severe Claustrophobia and Anxiety.  MOB was pleasant and welcoming of CSW's visit and appeared open to talking about her concerns.  She states she has been feeling increasingly claustrophobic throughout this pregnancy and notes this as a symptom of this pregnancy, but not her previous pregnancies.  She reports that she has always struggled with mild claustrophobia, however.  She states it became hard to go to her OB appointments because of the elevator to get to the office.  She states she has "techniques" to cope with her symptoms.  CSW asked about these and she states she can focus on something else, or count the lines on her chapstick or other things nearby.  CSW acknowledged these positive coping strategies.   MOB reports that she had a major incident yesterday and feels badly about the situation.  She states she is thankful for the nurses who took care of her and acknowledges that she overreacted.  She states she was scheduled to get her tubes tied, but felt like she was out of control due to not being able to move from the epidural and felt as though the walls were closing in as she was wheeled to the operating room.  She states the operating room felt small and closed in and she couldn't handle it.  She recalls begging for her life, and laughs at this remark today.  CSW validated her feelings of being out of control and asked that she try not to worry about the hospital staff who had to care for her.  She appears embarrassed about the situation.  CSW also reminded MOB that this is an emotionally sensitive time as she is newly postpartum.  CSW asked about how she is feeling with not getting her tubes tied, and she reports that FOB will be getting a vasectomy instead.  FOB appears supportive and agreeable to this plan. CSW reviewed signs and symptoms of perinatal mood disorders at length and asked that MOB commit to talking with  her MD if she has concerns at any time.  CSW offered to have her call a CSW at the hospital at any time as well.  CSW informed MOB of the Feelings After Birth support group held at [facility] and how to enroll if she is interested at any time.  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  She states a willingness to consider these things if concerns arise.   MOB and FOB thank CSW for the visit and seemed appreciative of CSW's concern for their emotional wellbeing.  CSW identifies no further intervention needed or any barriers to discharge.

## 2017-05-13 ENCOUNTER — Ambulatory Visit: Payer: Self-pay | Admitting: Internal Medicine

## 2017-05-29 ENCOUNTER — Ambulatory Visit: Payer: Medicaid Other | Admitting: Nurse Practitioner

## 2017-05-29 ENCOUNTER — Encounter: Payer: Self-pay | Admitting: Nurse Practitioner

## 2017-05-29 VITALS — BP 135/96 | HR 95 | Resp 16 | Ht 66.0 in | Wt 199.6 lb

## 2017-05-29 DIAGNOSIS — F411 Generalized anxiety disorder: Secondary | ICD-10-CM | POA: Diagnosis not present

## 2017-05-29 DIAGNOSIS — R635 Abnormal weight gain: Secondary | ICD-10-CM | POA: Diagnosis not present

## 2017-05-29 DIAGNOSIS — R5383 Other fatigue: Secondary | ICD-10-CM

## 2017-05-29 DIAGNOSIS — E559 Vitamin D deficiency, unspecified: Secondary | ICD-10-CM | POA: Diagnosis not present

## 2017-05-29 MED ORDER — FLUOXETINE HCL 10 MG PO TABS: 10.0000 mg | ORAL_TABLET | Freq: Every day | ORAL | 1 refills | Status: DC

## 2017-05-29 NOTE — Progress Notes (Addendum)
[facility] [address] [place], [place] [address]  Internal MEDICINE  Office Visit Note  Patient Name: [name] [name]  [id number]  [id number]  Date of Service: 06/08/2017  No chief complaint on file.   Depression       The patient presents with depression.  This is a new problem.  The current episode started more than 1 month ago.   The onset quality is gradual.   The problem occurs constantly.  The problem has been gradually worsening since onset.  Associated symptoms include fatigue, insomnia, irritable, decreased interest and headaches.  Associated symptoms include no suicidal ideas.     The symptoms are aggravated by family issues.  Past treatments include nothing.  Risk factors include stress and major life event.   Past medical history includes anxiety and depression.     Pertinent negatives include no suicide attempts.   Pt is here for routine follow up.    Current Medication: Outpatient Encounter Medications as of 05/29/2017  Medication Sig  . ibuprofen (ADVIL,MOTRIN) 800 MG tablet Take 1 tablet (800 mg total) by mouth every 8 (eight) hours as needed for moderate pain.  Marland Kitchen FLUoxetine (PROZAC) 10 MG tablet Take 1 tablet (10 mg total) by mouth daily.  . methylergonovine (METHERGINE) 0.2 MG tablet Take 1 tablet (0.2 mg total) by mouth every 6 (six) hours. (Patient not taking: Reported on 04/26/2015)  . oxyCODONE-acetaminophen (PERCOCET/ROXICET) 5-325 MG tablet Take 1 tablet by mouth every 4 (four) hours as needed (for pain scale 4-7). (Patient not taking: Reported on 05/29/2017)  . Prenatal Vit-Fe Fumarate-FA (PRENATAL MULTIVITAMIN) TABS tablet Take 1 tablet by mouth daily at 12 noon.   No facility-administered encounter medications on file as of 05/29/2017.     Surgical History: Past Surgical History:  Procedure Laterality Date  . TONSILLECTOMY    . WISDOM TOOTH EXTRACTION      Medical History: Past Medical History:  Diagnosis Date  . Anxiety   . Former  smoker   . Medical history non-contributory   . No pertinent past medical history     Family History: Family History  Problem Relation Age of Onset  . Migraines Mother   . Breast cancer Paternal Aunt   . Breast cancer Paternal Grandmother     Social History   Socioeconomic History  . Marital status: Married    Spouse name: Not on file  . Number of children: Not on file  . Years of education: Not on file  . Highest education level: Not on file  Social Needs  . Financial resource strain: Not on file  . Food insecurity - worry: Not on file  . Food insecurity - inability: Not on file  . Transportation needs - medical: Not on file  . Transportation needs - non-medical: Not on file  Occupational History  . Not on file  Tobacco Use  . Smoking status: Former Games developer  . Smokeless tobacco: Never Used  Substance and Sexual Activity  . Alcohol use: No  . Drug use: No  . Sexual activity: Yes    Birth control/protection: None  Other Topics Concern  . Not on file  Social History Narrative  . Not on file      Review of Systems  Constitutional: Positive for activity change and fatigue. Negative for chills and unexpected weight change.  HENT: Negative for congestion, postnasal drip, rhinorrhea, sinus pain, sneezing and sore throat.   Eyes: Negative.  Negative for redness.  Respiratory: Negative for cough, chest  tightness and shortness of breath.   Cardiovascular: Negative for chest pain and palpitations.  Gastrointestinal: Negative for abdominal pain, constipation, diarrhea, nausea and vomiting.  Genitourinary: Negative for dysuria and frequency.  Musculoskeletal: Positive for arthralgias. Negative for back pain, joint swelling and neck pain.  Skin: Negative for rash.  Neurological: Positive for headaches. Negative for tremors and numbness.  Hematological: Negative for adenopathy. Does not bruise/bleed easily.  Psychiatric/Behavioral: Positive for behavioral problems  (Depression) and depression. Negative for sleep disturbance and suicidal ideas. The patient is nervous/anxious and has insomnia.    Today's Vitals   05/29/17 1550  BP: (!) 135/96  Pulse: 95  Resp: 16  SpO2: 97%  Weight: 199 lb 9.6 oz (90.5 kg)  Height: 5\' 6"  (1.676 m)     Physical Exam  Constitutional: She is oriented to person, place, and time. She appears well-developed and well-nourished. She is irritable. No distress.  HENT:  Head: Normocephalic and atraumatic.  Mouth/Throat: Oropharynx is clear and moist. No oropharyngeal exudate.  Eyes: EOM are normal. Pupils are equal, round, and reactive to light.  Neck: Normal range of motion. Neck supple. No JVD present. No tracheal deviation present. No thyromegaly present.  Cardiovascular: Normal rate, regular rhythm and normal heart sounds. Exam reveals no gallop and no friction rub.  No murmur heard. Pulmonary/Chest: Effort normal. No respiratory distress. She has no wheezes. She has no rales. She exhibits no tenderness.  Abdominal: Soft. Bowel sounds are normal. There is no tenderness.  Musculoskeletal: Normal range of motion.  Lymphadenopathy:    She has no cervical adenopathy.  Neurological: She is alert and oriented to person, place, and time. No cranial nerve deficit.  Skin: Skin is warm and dry. She is not diaphoretic.  Psychiatric: Her speech is normal and behavior is normal. Judgment and thought content normal. Her mood appears anxious. Cognition and memory are normal. She exhibits a depressed mood.  Nursing note and vitals reviewed.  Assessment/Plan: 1. Other fatigue Check labs - CBC with Differential/Platelet - Comprehensive metabolic panel - T4, free - TSH  2. Abnormal weight gain Check labs  - T4, free - TSH - Lipid panel  3. Vitamin D deficiency Lab check - Vitamin D 1,25 dihydroxy  4. GAD (generalized anxiety disorder) - FLUoxetine (PROZAC) 10 MG tablet; Take 1 tablet (10 mg total) by mouth daily.   Dispense: 30 tablet; Refill: 1  General Counseling: Verlon AuLeslie verbalizes understanding of the findings of todays visit and agrees with plan of treatment. I have discussed any further diagnostic evaluation that may be needed or ordered today. We also reviewed her medications today. she has been encouraged to call the office with any questions or concerns that should arise related to todays visit.   This patient was seen by Vincent GrosHeather Ianna Salmela, FNP- C in Collaboration with Dr Lyndon CodeFozia M Khan as a part of collaborative care agreement    Orders Placed This Encounter  Procedures  . CBC with Differential/Platelet  . Comprehensive metabolic panel  . T4, free  . TSH  . Lipid panel  . Vitamin D 1,25 dihydroxy    Meds ordered this encounter  Medications  . FLUoxetine (PROZAC) 10 MG tablet    Sig: Take 1 tablet (10 mg total) by mouth daily.    Dispense:  30 tablet    Refill:  1    Order Specific Question:   Supervising Provider    Answer:   Lyndon CodeKHAN, FOZIA M [[id number]]    Time spent: 25 Minutes  Dr Lavera Guise Internal medicine

## 2017-06-08 DIAGNOSIS — R635 Abnormal weight gain: Secondary | ICD-10-CM | POA: Insufficient documentation

## 2017-06-08 DIAGNOSIS — R5383 Other fatigue: Secondary | ICD-10-CM | POA: Insufficient documentation

## 2017-06-08 DIAGNOSIS — E559 Vitamin D deficiency, unspecified: Secondary | ICD-10-CM | POA: Insufficient documentation

## 2017-06-08 DIAGNOSIS — F411 Generalized anxiety disorder: Secondary | ICD-10-CM | POA: Insufficient documentation

## 2017-06-26 ENCOUNTER — Ambulatory Visit: Payer: Self-pay | Admitting: Nurse Practitioner

## 2017-07-24 ENCOUNTER — Encounter: Payer: Self-pay | Admitting: Nurse Practitioner

## 2018-04-24 ENCOUNTER — Other Ambulatory Visit: Payer: Self-pay | Admitting: Nurse Practitioner

## 2018-05-05 ENCOUNTER — Encounter: Payer: Self-pay | Admitting: Certified Nurse Midwife

## 2018-06-05 ENCOUNTER — Encounter: Payer: Self-pay | Admitting: Certified Nurse Midwife

## 2018-06-05 ENCOUNTER — Other Ambulatory Visit (HOSPITAL_COMMUNITY)
Admission: RE | Admit: 2018-06-05 | Discharge: 2018-06-05 | Disposition: A | Discharge status: Home / Self Care | Payer: 59 | Source: Ambulatory Visit | Attending: Certified Nurse Midwife | Admitting: Certified Nurse Midwife

## 2018-06-05 ENCOUNTER — Ambulatory Visit (INDEPENDENT_AMBULATORY_CARE_PROVIDER_SITE_OTHER): Payer: 59 | Admitting: Certified Nurse Midwife

## 2018-06-05 VITALS — BP 125/91 | HR 78 | Ht 66.0 in | Wt 201.5 lb

## 2018-06-05 DIAGNOSIS — Z1239 Encounter for other screening for malignant neoplasm of breast: Secondary | ICD-10-CM | POA: Diagnosis not present

## 2018-06-05 DIAGNOSIS — Z124 Encounter for screening for malignant neoplasm of cervix: Secondary | ICD-10-CM | POA: Diagnosis present

## 2018-06-05 DIAGNOSIS — L700 Acne vulgaris: Secondary | ICD-10-CM

## 2018-06-05 DIAGNOSIS — L659 Nonscarring hair loss, unspecified: Secondary | ICD-10-CM | POA: Diagnosis not present

## 2018-06-05 DIAGNOSIS — R000 Tachycardia, unspecified: Secondary | ICD-10-CM

## 2018-06-05 DIAGNOSIS — Z01419 Encounter for gynecological examination (general) (routine) without abnormal findings: Secondary | ICD-10-CM

## 2018-06-05 MED ORDER — FLUOXETINE HCL 20 MG PO TABS: 20.0000 mg | ORAL_TABLET | Freq: Every day | ORAL | 1 refills | Status: DC

## 2018-06-05 NOTE — Progress Notes (Signed)
GYNECOLOGY ANNUAL PREVENTATIVE CARE ENCOUNTER NOTE  Subjective:   [name] [name] is a 43 y.o. [phone] female here for a routine annual gynecologic exam.  Current complaints: of heart racing, feeling light headed. She states that her mom has history of atrial fibrillation.  This occurs monthly. She complains of anxiety. Was previously on medications but would like to start them again. She complains of area on her back that is enlarged and tender, and she complains of hair loss.    Denies abnormal vaginal bleeding, discharge, pelvic pain, problems with intercourse or other gynecologic concerns.    Gynecologic History Patient's last menstrual period was 05/31/2018 (exact date). Contraception: none, not currently sexually active , separated from spouse Last Pap: its been a few yrs.. Results were: normal per pt.  Last mammogram: has never had one.   Obstetric History OB History  Gravida Para Term Preterm AB Living  4 3 3   1 3   SAB TAB Ectopic Multiple Live Births  1     0 3    # Outcome Date GA Lbr Len/2nd Weight Sex Delivery Anes PTL Lv  4 Term 04/26/15 [redacted]w[redacted]d 08:15 / 00:09 7 lb 9 oz (3.43 kg) M Vag-Spont EPI  LIV  3 SAB 06/2013          2 Term 03/27/11 [redacted]w[redacted]d 17:38 / 00:11 7 lb 15 oz (3.6 kg) M Vag-Spont EPI  LIV  1 Term 10/2009 [redacted]w[redacted]d  7 lb 9 oz (3.43 kg) M Vag-Spont EPI  LIV    Past Medical History:  Diagnosis Date  . Anxiety   . Former smoker   . Medical history non-contributory   . No pertinent past medical history     Past Surgical History:  Procedure Laterality Date  . TONSILLECTOMY    . WISDOM TOOTH EXTRACTION      Current Outpatient Medications on File Prior to Visit  Medication Sig Dispense Refill  . FLUoxetine (PROZAC) 10 MG tablet Take 1 tablet (10 mg total) by mouth daily. 30 tablet 1  . ibuprofen (ADVIL,MOTRIN) 800 MG tablet Take 1 tablet (800 mg total) by mouth every 8 (eight) hours as needed for moderate pain. 30 tablet 0  . methylergonovine (METHERGINE) 0.2  MG tablet Take 1 tablet (0.2 mg total) by mouth every 6 (six) hours. (Patient not taking: Reported on 04/26/2015) 9 tablet 0  . oxyCODONE-acetaminophen (PERCOCET/ROXICET) 5-325 MG tablet Take 1 tablet by mouth every 4 (four) hours as needed (for pain scale 4-7). (Patient not taking: Reported on 05/29/2017) 20 tablet 0  . Prenatal Vit-Fe Fumarate-FA (PRENATAL MULTIVITAMIN) TABS tablet Take 1 tablet by mouth daily at 12 noon.     No current facility-administered medications on file prior to visit.     No Known Allergies  Social History:  reports that she has quit smoking. She has never used smokeless tobacco. She reports that she does not drink alcohol or use drugs.  Family History  Problem Relation Age of Onset  . Migraines Mother   . Breast cancer Paternal Aunt   . Breast cancer Paternal Grandmother     The following portions of the patient's history were reviewed and updated as appropriate: allergies, current medications, past family history, past medical history, past social history, past surgical history and problem list.  Review of Systems Pertinent items noted in HPI and remainder of comprehensive ROS otherwise negative.   Objective:  BP (!) 125/91   Pulse 78   Ht 5\' 6"  (1.676 m)   Wt 201  lb 8 oz (91.4 kg)   LMP 05/31/2018 (Exact Date)   Breastfeeding No   BMI 32.52 kg/m  CONSTITUTIONAL: Well-developed, well-nourished obese female in no acute distress.  HENT:  Normocephalic, atraumatic, External right and left ear normal. Oropharynx is clear and moist EYES: Conjunctivae and EOM are normal. Pupils are equal, round, and reactive to light. No scleral icterus.  NECK: Normal range of motion, supple, no masses.  Normal thyroid.  SKIN: Skin is warm and dry. No rash noted. Not diaphoretic. No erythema. No pallor. MUSCULOSKELETAL: Normal range of motion. No tenderness.  No cyanosis, clubbing, or edema.  2+ distal pulses. NEUROLOGIC: Alert and oriented to person, place, and time.  Normal reflexes, muscle tone coordination. No cranial nerve deficit noted. PSYCHIATRIC: Normal mood and affect.anxious Normal judgment and thought content. CARDIOVASCULAR: Normal heart rate noted, regular rhythm RESPIRATORY: Clear to auscultation bilaterally. Effort and breath sounds normal, no problems with respiration noted. BREASTS: Symmetric in size. No masses, skin changes, inverted nipples, no nipple drainage, or lymphadenopathy. ABDOMEN: Soft, normal bowel sounds, no distention noted.  No tenderness, rebound or guarding.  PELVIC: Normal appearing external genitalia; normal appearing vaginal mucosa and cervix.  No abnormal discharge noted.  Pap smear obtained.  Normal uterine size, no other palpable masses, no uterine or adnexal tenderness.   GAD 7 : Generalized Anxiety Score 06/05/2018  Nervous, Anxious, on Edge 2  Control/stop worrying 1  Worry too much - different things 1  Trouble relaxing 2  Restless 1  Easily annoyed or irritable 2  Afraid - awful might happen 1  Total GAD 7 Score 10  Anxiety Difficulty Somewhat difficult    Assessment and Plan:  Women's annual routine GYN exam  Will follow up results of pap smear and manage accordingly. Mammogram scheduled Labs today Lipid profile, and thyroid panel Referral to cardiology and dermatology  Orders to start Prozac 20mg  daily  Routine preventative health maintenance measures emphasized. Please refer to After Visit Summary for other counseling recommendations.  Follow 4-6 wks for medication check.   Doreene BurkeAnnie Adda Stokes, CNM

## 2018-06-05 NOTE — Patient Instructions (Signed)
Preventive Care 40-64 Years, Female Preventive care refers to lifestyle choices and visits with your health care provider that can promote health and wellness. What does preventive care include?   A yearly physical exam. This is also called an annual well check.  Dental exams once or twice a year.  Routine eye exams. Ask your health care provider how often you should have your eyes checked.  Personal lifestyle choices, including: ? Daily care of your teeth and gums. ? Regular physical activity. ? Eating a healthy diet. ? Avoiding tobacco and drug use. ? Limiting alcohol use. ? Practicing safe sex. ? Taking low-dose aspirin daily starting at age 50. ? Taking vitamin and mineral supplements as recommended by your health care provider. What happens during an annual well check? The services and screenings done by your health care provider during your annual well check will depend on your age, overall health, lifestyle risk factors, and family history of disease. Counseling Your health care provider may ask you questions about your:  Alcohol use.  Tobacco use.  Drug use.  Emotional well-being.  Home and relationship well-being.  Sexual activity.  Eating habits.  Work and work environment.  Method of birth control.  Menstrual cycle.  Pregnancy history. Screening You may have the following tests or measurements:  Height, weight, and BMI.  Blood pressure.  Lipid and cholesterol levels. These may be checked every 5 years, or more frequently if you are over 50 years old.  Skin check.  Lung cancer screening. You may have this screening every year starting at age 55 if you have a 30-pack-year history of smoking and currently smoke or have quit within the past 15 years.  Colorectal cancer screening. All adults should have this screening starting at age 50 and continuing until age 75. Your health care provider may recommend screening at age 45. You will have tests every  1-10 years, depending on your results and the type of screening test. People at increased risk should start screening at an earlier age. Screening tests may include: ? Guaiac-based fecal occult blood testing. ? Fecal immunochemical test (FIT). ? Stool DNA test. ? Virtual colonoscopy. ? Sigmoidoscopy. During this test, a flexible tube with a tiny camera (sigmoidoscope) is used to examine your rectum and lower colon. The sigmoidoscope is inserted through your anus into your rectum and lower colon. ? Colonoscopy. During this test, a long, thin, flexible tube with a tiny camera (colonoscope) is used to examine your entire colon and rectum.  Hepatitis C blood test.  Hepatitis B blood test.  Sexually transmitted disease (STD) testing.  Diabetes screening. This is done by checking your blood sugar (glucose) after you have not eaten for a while (fasting). You may have this done every 1-3 years.  Mammogram. This may be done every 1-2 years. Talk to your health care provider about when you should start having regular mammograms. This may depend on whether you have a family history of breast cancer.  BRCA-related cancer screening. This may be done if you have a family history of breast, ovarian, tubal, or peritoneal cancers.  Pelvic exam and Pap test. This may be done every 3 years starting at age 21. Starting at age 30, this may be done every 5 years if you have a Pap test in combination with an HPV test.  Bone density scan. This is done to screen for osteoporosis. You may have this scan if you are at high risk for osteoporosis. Discuss your test results, treatment options,   and if necessary, the need for more tests with your health care provider. Vaccines Your health care provider may recommend certain vaccines, such as:  Influenza vaccine. This is recommended every year.  Tetanus, diphtheria, and acellular pertussis (Tdap, Td) vaccine. You may need a Td booster every 10 years.  Varicella  vaccine. You may need this if you have not been vaccinated.  Zoster vaccine. You may need this after age 38.  Measles, mumps, and rubella (MMR) vaccine. You may need at least one dose of MMR if you were born in 1957 or later. You may also need a second dose.  Pneumococcal 13-valent conjugate (PCV13) vaccine. You may need this if you have certain conditions and were not previously vaccinated.  Pneumococcal polysaccharide (PPSV23) vaccine. You may need one or two doses if you smoke cigarettes or if you have certain conditions.  Meningococcal vaccine. You may need this if you have certain conditions.  Hepatitis A vaccine. You may need this if you have certain conditions or if you travel or work in places where you may be exposed to hepatitis A.  Hepatitis B vaccine. You may need this if you have certain conditions or if you travel or work in places where you may be exposed to hepatitis B.  Haemophilus influenzae type b (Hib) vaccine. You may need this if you have certain conditions. Talk to your health care provider about which screenings and vaccines you need and how often you need them. This information is not intended to replace advice given to you by your health care provider. Make sure you discuss any questions you have with your health care provider. Document Released: 05/12/2015 Document Revised: 06/05/2017 Document Reviewed: 02/14/2015 Elsevier Interactive Patient Education  2019 [employer].

## 2018-06-06 LAB — LIPID PANEL
Chol/HDL Ratio: 2.8 ratio (ref 0.0–4.4)
Cholesterol, Total: 147 mg/dL (ref 100–199)
HDL: 53 mg/dL (ref >39)
LDL Calculated: 76 mg/dL (ref 0–99)
Triglycerides: 91 mg/dL (ref 0–149)
VLDL Cholesterol Cal: 18 mg/dL (ref 5–40)

## 2018-06-06 LAB — THYROID PANEL WITH TSH
Free Thyroxine Index: 1.9 (ref 1.2–4.9)
T3 Uptake Ratio: 24 % (ref 24–39)
T4, Total: 8.1 ug/dL (ref 4.5–12.0)
TSH: 1.48 u[IU]/mL (ref 0.450–4.500)

## 2018-06-08 ENCOUNTER — Telehealth: Payer: Self-pay

## 2018-06-08 LAB — CYTOLOGY - PAP
Diagnosis: NEGATIVE
HPV: NOT DETECTED

## 2018-06-08 NOTE — Telephone Encounter (Signed)
Voicemail message left for pt- normal lab results per AT.

## 2018-06-09 MED ORDER — CLINDAMYCIN PHOSPHATE 900 MG/50ML IV SOLN
INTRAVENOUS | Status: AC
  Filled 2018-06-09: qty 50

## 2018-07-10 ENCOUNTER — Encounter: Payer: 59 | Admitting: Certified Nurse Midwife

## 2018-12-22 ENCOUNTER — Other Ambulatory Visit: Payer: Self-pay | Admitting: Obstetrics and Gynecology

## 2018-12-22 NOTE — Telephone Encounter (Signed)
pls advise

## 2019-02-10 ENCOUNTER — Encounter: Payer: Self-pay | Admitting: Certified Nurse Midwife

## 2019-02-10 ENCOUNTER — Ambulatory Visit (INDEPENDENT_AMBULATORY_CARE_PROVIDER_SITE_OTHER): Payer: BC Managed Care – PPO | Admitting: Certified Nurse Midwife

## 2019-02-10 ENCOUNTER — Other Ambulatory Visit: Payer: Self-pay

## 2019-02-10 VITALS — BP 127/81 | HR 88 | Ht 66.0 in | Wt 199.0 lb

## 2019-02-10 DIAGNOSIS — L02818 Cutaneous abscess of other sites: Secondary | ICD-10-CM

## 2019-02-10 DIAGNOSIS — R002 Palpitations: Secondary | ICD-10-CM | POA: Diagnosis not present

## 2019-02-10 DIAGNOSIS — M722 Plantar fascial fibromatosis: Secondary | ICD-10-CM

## 2019-02-10 DIAGNOSIS — F411 Generalized anxiety disorder: Secondary | ICD-10-CM

## 2019-02-10 DIAGNOSIS — E669 Obesity, unspecified: Secondary | ICD-10-CM

## 2019-02-10 MED ORDER — FLUOXETINE HCL 10 MG PO TABS: 10.0000 mg | ORAL_TABLET | Freq: Every day | ORAL | 5 refills | Status: AC

## 2019-02-10 MED ORDER — CYANOCOBALAMIN 1000 MCG/ML IJ SOLN: 1000.0000 ug | Freq: Once | INTRAMUSCULAR | 6 refills | Status: AC

## 2019-02-10 NOTE — Progress Notes (Signed)
Subjective:  [name] [name] is a 43 y.o. [phone] at Unknown being seen today for weight loss management- initial visit.  Patient reports General ROS: negative and reports previous weight loss attempts: on an off for years Management changes made at the last visit include: , changing medication dose and ordering test(s).  Onset was /gradual,  Over several years.  Onset followed:  change in living environment, and mental status changes. Associated symptoms include: fatigue, depression, anxiety, change in clothing fit and menstrual changes. Previous/Current treatment includes: , antidepressant,  Pertinent medical history includes: chronic digestive disease, diabetes, eating disorder, anxiety and psychiatric illness.  Risk factors include: Single mother with 3 childrens  and minimal social support  The patient has a surgical history of: Pertinent social history includes: alcohol abuse, tobacco abuse and marijuana use. Past evaluation has included: metabolic profile, hemoglobin A1c, thyroid panel  and psychiatric evaluation.  Past treatment has included: diet and exercise. GAD 7 : Generalized Anxiety Score 02/10/2019 06/05/2018  Nervous, Anxious, on Edge 1 2  Control/stop worrying 0 1  Worry too much - different things 1 1  Trouble relaxing 1 2  Restless 0 1  Easily annoyed or irritable 1 2  Afraid - awful might happen 1 1  Total GAD 7 Score 5 10  Anxiety Difficulty Somewhat difficult Somewhat difficult    PHQ9 SCORE ONLY 02/10/2019 05/29/2017  Score 6 1    The following portions of the patient's history were reviewed and updated as appropriate: allergies, current medications, past family history, past medical history, past social history, past surgical history and problem list.   Objective:   Vitals:   02/10/19 1359  BP: 127/81  Pulse: 88  Weight: 199 lb (90.3 kg)  Height: 5\' 6"  (1.676 m)    General:  Alert, oriented and cooperative. Patient is in no acute distress.  PE: Well  groomed female in no current distress,   Mental Status: Normal mood and affect. Normal behavior. Normal judgment and thought content.   Current BMI: Body mass index is 32.12 kg/m.   Assessment and Plan:  Obesity  Plan: low carb, High protein diet. Exercise 3 x wk 20-30 min.  Pt to have cardiology appointment then once cleared will start medications.  RX  B12 1060mcg.ml monthly, to start now with first injection at next visit.  Reviewed side-effects common to both medications and expected outcomes. Increase daily water intake to at least 8 bottle a day, every day.  Goal is to reduse weight by 10% by end of three months, and will re-evaluate then.  RTC in 4 weeks for Nurse visit to check weight & BP, and get next B12 injections. Referral today for cardiology, dermatology, and podiatrist. Prozac dose change to 10 mg daily.   Please refer to After Visit Summary for other counseling recommendations.    Philip Aspen, CNM   Consider the Low Glycemic Index Diet and 6 smaller meals daily .  This boosts your metabolism and regulates your sugars:   Use the protein bar by Atkins because they have lots of fiber in them  Find the low carb flatbreads, tortillas and pita breads for sandwiches:  Joseph's makes a pita bread and a flat bread , available at [facility] and [employer]; [place] makes a low carb flatbread available at [employer] and HT that is 9 net carbs and 100 cal [place] makes a low carb whole wheat tortilla available at [employer] most grocery stores with 6 net carbs and 210 cal  [place]  yogurt can still have a lot of carbs .  Dannon Light N fit has 80 cal and 8 carbs

## 2019-02-11 LAB — COMPREHENSIVE METABOLIC PANEL
ALT: 13 IU/L (ref 0–32)
AST: 15 IU/L (ref 0–40)
Albumin/Globulin Ratio: 1.7 (ref 1.2–2.2)
Albumin: 4.5 g/dL (ref 3.8–4.8)
Alkaline Phosphatase: 138 IU/L — ABNORMAL HIGH (ref 39–117)
BUN/Creatinine Ratio: 17 (ref 9–23)
BUN: 11 mg/dL (ref 6–24)
Bilirubin Total: 0.2 mg/dL (ref 0.0–1.2)
CO2: 24 mmol/L (ref 20–29)
Calcium: 9.3 mg/dL (ref 8.7–10.2)
Chloride: 102 mmol/L (ref 96–106)
Creatinine, Ser: 0.66 mg/dL (ref 0.57–1.00)
GFR calc Af Amer: 127 mL/min/{1.73_m2} (ref >59)
GFR calc non Af Amer: 110 mL/min/{1.73_m2} (ref >59)
Globulin, Total: 2.7 g/dL (ref 1.5–4.5)
Glucose: 64 mg/dL — ABNORMAL LOW (ref 65–99)
Potassium: 4.2 mmol/L (ref 3.5–5.2)
Sodium: 140 mmol/L (ref 134–144)
Total Protein: 7.2 g/dL (ref 6.0–8.5)

## 2019-02-11 LAB — HEMOGLOBIN A1C
Est. average glucose Bld gHb Est-mCnc: 111 mg/dL
Hgb A1c MFr Bld: 5.5 % (ref 4.8–5.6)

## 2019-02-12 ENCOUNTER — Ambulatory Visit: Payer: 59 | Admitting: Cardiology

## 2019-02-23 ENCOUNTER — Ambulatory Visit (INDEPENDENT_AMBULATORY_CARE_PROVIDER_SITE_OTHER): Payer: BC Managed Care – PPO

## 2019-02-23 ENCOUNTER — Encounter: Payer: Self-pay | Admitting: Podiatry

## 2019-02-23 ENCOUNTER — Ambulatory Visit: Payer: BC Managed Care – PPO | Admitting: Podiatry

## 2019-02-23 ENCOUNTER — Other Ambulatory Visit: Payer: Self-pay

## 2019-02-23 DIAGNOSIS — M722 Plantar fascial fibromatosis: Secondary | ICD-10-CM

## 2019-02-23 MED ORDER — DICLOFENAC SODIUM 75 MG PO TBEC: 75.0000 mg | DELAYED_RELEASE_TABLET | Freq: Two times a day (BID) | ORAL | 1 refills | Status: DC

## 2019-02-23 MED ORDER — METHYLPREDNISOLONE 4 MG PO TBPK: ORAL_TABLET | ORAL | 0 refills | Status: DC

## 2019-02-25 NOTE — Progress Notes (Signed)
   Subjective: 43 y.o. female presenting today as a new patient with a chief complaint of bilateral heel pain that began four months ago. She states the pain is worse in the morning and when she stands after being seated for a long period of time. She has been wearing inserts, soaking in Epsom salt, rolling on a tennis ball and cold water bottles. Patient is here for further evaluation and treatment.   Past Medical History:  Diagnosis Date  . Anxiety   . Former smoker   . Medical history non-contributory   . No pertinent past medical history      Objective: Physical Exam General: The patient is alert and oriented x3 in no acute distress.  Dermatology: Skin is warm, dry and supple bilateral lower extremities. Negative for open lesions or macerations bilateral.   Vascular: Dorsalis Pedis and Posterior Tibial pulses palpable bilateral.  Capillary fill time is immediate to all digits.  Neurological: Epicritic and protective threshold intact bilateral.   Musculoskeletal: Tenderness to palpation to the plantar aspect of the bilateral heels along the plantar fascia. All other joints range of motion within normal limits bilateral. Strength 5/5 in all groups bilateral.   Radiographic exam: Normal osseous mineralization. Joint spaces preserved. No fracture/dislocation/boney destruction. No other soft tissue abnormalities or radiopaque foreign bodies.   Assessment: 1. plantar fasciitis bilateral feet  Plan of Care:  1. Patient evaluated. Xrays reviewed.   2. Injection of 0.5cc Celestone soluspan injected into the bilateral heels.  3. Rx for Medrol Dose Pak placed 4. Rx for Diclofenac ordered for patient. 5. Plantar fascial band(s) dispensed for bilateral plantar fasciitis. 6. Instructed patient regarding therapies and modalities at home to alleviate symptoms.  7. OTC Powerstep insoles provided to patient.  8. Return to clinic in 4 weeks.    Works at [employer].    Edrick Kins, DPM [facility]  Dr. Edrick Kins, DPM    [address], [place] [address]                Office [phone]  Fax [phone]

## 2019-03-12 ENCOUNTER — Ambulatory Visit (INDEPENDENT_AMBULATORY_CARE_PROVIDER_SITE_OTHER): Payer: BC Managed Care – PPO | Admitting: Cardiology

## 2019-03-12 ENCOUNTER — Other Ambulatory Visit: Payer: Self-pay

## 2019-03-12 ENCOUNTER — Encounter: Payer: Self-pay | Admitting: Cardiology

## 2019-03-12 VITALS — BP 140/90 | HR 114 | Temp 97.3°F | Ht 66.0 in | Wt 200.5 lb

## 2019-03-12 DIAGNOSIS — R002 Palpitations: Secondary | ICD-10-CM | POA: Diagnosis not present

## 2019-03-12 NOTE — Progress Notes (Signed)
Cardiology Office Note:    Date:  03/12/2019   ID:  [name] [name], DOB 04-27-1976, MRN [id number]  PCP:  Ronnell Freshwater, NP  Cardiologist:  No primary care provider on file.  Electrophysiologist:  None   Referring MD: Philip Aspen, CNM   [name] [name] is a 43 y.o. female who is being seen today for the evaluation of palpitations at the request of Philip Aspen, [place].  Chief Complaint  Patient presents with  . New Patient (Initial Visit)    Palpitations; Meds verbally reviewed with patient.    History of Present Illness:    [name] [name] is a 43 y.o. female with a hx of anxiety, former smoker who presents due to palpitations.  Patient states having palpitations for about a year now.  Symptoms started about July 2019 while she was at work.  She was sitting and then suddenly noticed her heart racing lasting a couple of seconds.  She took a couple of deep breaths with resolution of symptoms.  Then in December 2019, patient had another episode while she was at work, this time she felt dizzy while she was sitting.  She again took deep breaths with resolution of symptoms then in March 2020, 8 months ago, she had a third episode while sitting.  She states having a lot of anxiety and being stressed about going through divorce.  She is claustrophobic, cannot walk down the halls with close windows or get into elevators.  She saw her PCP last March and was prescribed Prozac 10 mg which she took for 3 days and made her symptoms worse so she stopped.  She denies any history of heart disease.  She has not had any more episodes of palpitations for over 8 months now.  She denies any syncope.  Past Medical History:  Diagnosis Date  . Anxiety   . Former smoker   . Medical history non-contributory   . No pertinent past medical history     Past Surgical History:  Procedure Laterality Date  . TONSILLECTOMY    . WISDOM TOOTH EXTRACTION      Current Medications: Current Meds   Medication Sig  . Collagen Hydrolysate POWD by Does not apply route.  . diclofenac (VOLTAREN) 75 MG EC tablet Take 1 tablet (75 mg total) by mouth 2 (two) times daily.  Marland Kitchen FLUoxetine (PROZAC) 10 MG tablet Take 1 tablet (10 mg total) by mouth daily.  Marland Kitchen ibuprofen (ADVIL,MOTRIN) 800 MG tablet Take 1 tablet (800 mg total) by mouth every 8 (eight) hours as needed for moderate pain.     Allergies:   Codeine   Social History   Socioeconomic History  . Marital status: Married    Spouse name: Not on file  . Number of children: Not on file  . Years of education: Not on file  . Highest education level: Not on file  Occupational History  . Not on file  Social Needs  . Financial resource strain: Not on file  . Food insecurity    Worry: Not on file    Inability: Not on file  . Transportation needs    Medical: Not on file    Non-medical: Not on file  Tobacco Use  . Smoking status: Former Research scientist (life sciences)  . Smokeless tobacco: Never Used  Substance and Sexual Activity  . Alcohol use: No  . Drug use: No  . Sexual activity: Not Currently    Birth control/protection: None  Lifestyle  . Physical activity  Days per week: Not on file    Minutes per session: Not on file  . Stress: Not on file  Relationships  . Social Musician on phone: Not on file    Gets together: Not on file    Attends religious service: Not on file    Active member of club or organization: Not on file    Attends meetings of clubs or organizations: Not on file    Relationship status: Not on file  Other Topics Concern  . Not on file  Social History Narrative  . Not on file     Family History: The patient's family history includes Breast cancer in her maternal aunt, paternal aunt, and paternal grandmother; Migraines in her mother.  ROS:   Please see the history of present illness.     All other systems reviewed and are negative.  EKGs/Labs/Other Studies Reviewed:    The following studies were reviewed  today:   EKG:  EKG is  ordered today.  The ekg ordered today demonstrates sinus tachycardia, heart rate 114.  Recent Labs: 06/05/2018: TSH 1.480 02/10/2019: ALT 13; BUN 11; Creatinine, Ser 0.66; Potassium 4.2; Sodium 140  Recent Lipid Panel    Component Value Date/Time   CHOL 147 06/05/2018 0858   TRIG 91 06/05/2018 0858   HDL 53 06/05/2018 0858   CHOLHDL 2.8 06/05/2018 0858   LDLCALC 76 06/05/2018 0858    Physical Exam:    VS:  BP 140/90 (BP Location: Right Arm, Patient Position: Sitting, Cuff Size: Normal)   Pulse (!) 114   Temp (!) 97.3 F (36.3 C)   Ht 5\' 6"  (1.676 m)   Wt 200 lb 8 oz (90.9 kg)   SpO2 97%   BMI 32.36 kg/m     Wt Readings from Last 3 Encounters:  03/12/19 200 lb 8 oz (90.9 kg)  02/10/19 199 lb (90.3 kg)  06/05/18 201 lb 8 oz (91.4 kg)     GEN:  Well nourished, well developed in no acute distress HEENT: Normal NECK: No JVD; No carotid bruits LYMPHATICS: No lymphadenopathy CARDIAC: Tachycardic, regular, no murmurs, rubs, gallops RESPIRATORY:  Clear to auscultation without rales, wheezing or rhonchi  ABDOMEN: Soft, non-tender, non-distended MUSCULOSKELETAL:  No edema; No deformity  SKIN: Warm and dry NEUROLOGIC:  Alert and oriented x 3 PSYCHIATRIC:  Normal affect   ASSESSMENT:   Patient with no cardiac risk factors presenting with infrequent palpitations.  Last episode was over 8 months ago.  Her history indicates she has anxiety and associated phobias.  Her EKG shows sinus tachycardia with heart rate 114.  I do not believe a cardiac monitor will be of diagnostic value due to the infrequency of her symptoms.  I also do not believe the etiology of her palpitation is secondary to her cardiac pathology.  I believe her anxiety and personal stressors are contributing. 1. Palpitations    PLAN:    In order of problems listed above:  1. EKG showing sinus tachycardia.  Palpitations not likely of cardiac origin.  I think patient will benefit from the input  of our psychiatric colleagues for management of anxiety.  She will follow up with her primary care provider for possible referral and or management.   Medication Adjustments/Labs and Tests Ordered: Current medicines are reviewed at length with the patient today.  Concerns regarding medicines are outlined above.  Orders Placed This Encounter  Procedures  . EKG 12-Lead   No orders of the defined types were  placed in this encounter.   Patient Instructions  Medication Instructions:  Your physician recommends that you continue on your current medications as directed. Please refer to the Current Medication list given to you today.  *If you need a refill on your cardiac medications before your next appointment, please call your pharmacy*  Lab Work: None ordered If you have labs (blood work) drawn today and your tests are completely normal, you will receive your results only by: Marland Kitchen. MyChart Message (if you have MyChart) OR . A paper copy in the mail If you have any lab test that is abnormal or we need to change your treatment, we will call you to review the results.  Testing/Procedures: None ordered  Follow-Up: At [facility], you and your health needs are our priority.  As part of our continuing mission to provide you with exceptional heart care, we have created designated Provider Care Teams.  These Care Teams include your primary Cardiologist (physician) and Advanced Practice Providers (APPs -  Physician Assistants and Nurse Practitioners) who all work together to provide you with the care you need, when you need it.  Your next appointment:   As needed  The format for your next appointment:   In Person  Provider:    You may see Dr. Azucena CecilAgbor-Etang  or one of the following Advanced Practice Providers on your designated Care Team:    Nicolasa Duckinghristopher Berge, NP  Eula Listenyan Dunn, PA-C  Marisue IvanJacquelyn Visser, PA-C   Other Instructions N/A     Signed, Debbe OdeaBrian Agbor-Etang, MD  03/12/2019 2:59  PM    [facility]

## 2019-03-12 NOTE — Patient Instructions (Addendum)
Medication Instructions:  Your physician recommends that you continue on your current medications as directed. Please refer to the Current Medication list given to you today.  *If you need a refill on your cardiac medications before your next appointment, please call your pharmacy*  Lab Work: None ordered If you have labs (blood work) drawn today and your tests are completely normal, you will receive your results only by: . MyChart Message (if you have MyChart) OR . A paper copy in the mail If you have any lab test that is abnormal or we need to change your treatment, we will call you to review the results.  Testing/Procedures: None ordered  Follow-Up: At [facility], you and your health needs are our priority.  As part of our continuing mission to provide you with exceptional heart care, we have created designated Provider Care Teams.  These Care Teams include your primary Cardiologist (physician) and Advanced Practice Providers (APPs -  Physician Assistants and Nurse Practitioners) who all work together to provide you with the care you need, when you need it.  Your next appointment:   As needed  The format for your next appointment:   In Person  Provider:    You may see Dr. Agbor- Etang or one of the following Advanced Practice Providers on your designated Care Team:    Christopher Berge, NP  Ryan Dunn, PA-C  Jacquelyn Visser, PA-C   Other Instructions N/A  

## 2019-03-30 ENCOUNTER — Other Ambulatory Visit: Payer: Self-pay

## 2019-03-30 ENCOUNTER — Ambulatory Visit: Payer: BC Managed Care – PPO | Admitting: Podiatry

## 2019-03-30 DIAGNOSIS — L989 Disorder of the skin and subcutaneous tissue, unspecified: Secondary | ICD-10-CM

## 2019-03-30 DIAGNOSIS — M722 Plantar fascial fibromatosis: Secondary | ICD-10-CM | POA: Diagnosis not present

## 2019-04-02 NOTE — Progress Notes (Signed)
   Subjective: 43 y.o. female presenting to the office today for follow up evaluation of plantar fasciitis of the bilateral feet. She states she is doing well. She reports significant relief from the injections and Medrol Dose Pak. She is still currently taking Diclofenac and using the insoles daily. She also notes a nonpainful callus to the plantar aspect of the right foot that appeared a few weeks ago. She denies modifying factors and has not done anything for treatment. Patient is here for further evaluation and treatment.    Past Medical History:  Diagnosis Date  . Anxiety   . Former smoker   . Medical history non-contributory   . No pertinent past medical history      Objective:  Physical Exam General: Alert and oriented x3 in no acute distress  Dermatology: Hyperkeratotic lesion(s) present on the right forefoot. Pain on palpation with a central nucleated core noted. Skin is warm, dry and supple bilateral lower extremities. Negative for open lesions or macerations.  Vascular: Palpable pedal pulses bilaterally. No edema or erythema noted. Capillary refill within normal limits.  Neurological: Epicritic and protective threshold grossly intact bilaterally.   Musculoskeletal Exam: Pain on palpation at the keratotic lesion(s) noted. Range of motion within normal limits bilateral. Muscle strength 5/5 in all groups bilateral.  Assessment: 1. Plantar fasciitis bilateral feet - resolved 2. Porokeratosis right forefoot    Plan of Care:  1. Patient evaluated 2. Excisional debridement of keratoic lesion(s) using a chisel blade was performed without incident. Salinocaine applied.  3. Dressed area with light dressing. 4. Continue taking Diclofenac.  5. Continue using OTC insoles.  6. Recommended OTC corn and callus remover.  7. Urea 40% cream dispensed.  8. Patient is to return to the clinic PRN.   Works at [employer].   Edrick Kins, DPM [facility]   Dr. Edrick Kins, [address]                                        [place], [place] [address]                Office [phone]  Fax [phone]

## 2019-07-20 ENCOUNTER — Ambulatory Visit: Payer: BC Managed Care – PPO | Admitting: Podiatry

## 2019-07-20 ENCOUNTER — Other Ambulatory Visit: Payer: Self-pay

## 2019-07-20 DIAGNOSIS — M722 Plantar fascial fibromatosis: Secondary | ICD-10-CM

## 2019-07-20 MED ORDER — METHYLPREDNISOLONE 4 MG PO TBPK: ORAL_TABLET | ORAL | 0 refills | Status: DC

## 2019-07-20 MED ORDER — DICLOFENAC SODIUM 75 MG PO TBEC: 75.0000 mg | DELAYED_RELEASE_TABLET | Freq: Two times a day (BID) | ORAL | 1 refills | Status: AC

## 2019-07-22 NOTE — Progress Notes (Signed)
   Subjective: 44 y.o. female presenting today for follow up evaluation of bilateral plantar fasciitis. She reports throbbing pain that started again about two months ago. Walking increases the pain. She has taken oral steroids and had injections which have helped alleviate the symptoms in the past. She has been wearing new shoes and insoles with compression socks for treatment lately. Patient is here for further evaluation and treatment.   Past Medical History:  Diagnosis Date  . Anxiety   . Former smoker   . Medical history non-contributory   . No pertinent past medical history      Objective: Physical Exam General: The patient is alert and oriented x3 in no acute distress.  Dermatology: Skin is warm, dry and supple bilateral lower extremities. Negative for open lesions or macerations bilateral.   Vascular: Dorsalis Pedis and Posterior Tibial pulses palpable bilateral.  Capillary fill time is immediate to all digits.  Neurological: Epicritic and protective threshold intact bilateral.   Musculoskeletal: Tenderness to palpation to the plantar aspect of the bilateral heels along the plantar fascia. All other joints range of motion within normal limits bilateral. Strength 5/5 in all groups bilateral.   Assessment: 1. plantar fasciitis bilateral feet  Plan of Care:  1. Patient evaluated.    2. Injection of 0.5cc Celestone soluspan injected into the bilateral heels.  3. Rx for Medrol Dose Pak placed 4. Rx for Diclofenac ordered for patient. 5. Appointment with Pedorthist for custom molded orthotics.  6. Instructed patient regarding therapies and modalities at home to alleviate symptoms.  7. Recommended 3 monthly injections beginning today.  8. Return to clinic in 4 weeks.    Felecia Shelling, DPM [facility]  Dr. Felecia Shelling, DPM    [address] [place], [place] [address]                Office [phone]  Fax [phone]

## 2019-08-05 ENCOUNTER — Telehealth: Payer: Self-pay | Admitting: Podiatry

## 2019-08-05 NOTE — Telephone Encounter (Signed)
Left message for pt to call to discuss orthotic benefits. °

## 2019-08-20 ENCOUNTER — Ambulatory Visit: Payer: BC Managed Care – PPO | Admitting: Podiatry

## 2019-08-23 ENCOUNTER — Ambulatory Visit (INDEPENDENT_AMBULATORY_CARE_PROVIDER_SITE_OTHER): Payer: BC Managed Care – PPO | Admitting: Certified Nurse Midwife

## 2019-08-23 ENCOUNTER — Other Ambulatory Visit: Payer: Self-pay

## 2019-08-23 ENCOUNTER — Encounter: Payer: Self-pay | Admitting: Certified Nurse Midwife

## 2019-08-23 VITALS — BP 132/83 | HR 79 | Ht 66.0 in | Wt 220.2 lb

## 2019-08-23 DIAGNOSIS — Z01419 Encounter for gynecological examination (general) (routine) without abnormal findings: Secondary | ICD-10-CM

## 2019-08-23 DIAGNOSIS — N926 Irregular menstruation, unspecified: Secondary | ICD-10-CM | POA: Diagnosis not present

## 2019-08-23 DIAGNOSIS — Z1231 Encounter for screening mammogram for malignant neoplasm of breast: Secondary | ICD-10-CM

## 2019-08-23 NOTE — Patient Instructions (Signed)
Preventive Care 43-43 Years Old, Female Preventive care refers to visits with your health care provider and lifestyle choices that can promote health and wellness. This includes:  A yearly physical exam. This may also be called an annual well check.  Regular dental visits and eye exams.  Immunizations.  Screening for certain conditions.  Healthy lifestyle choices, such as eating a healthy diet, getting regular exercise, not using drugs or products that contain nicotine and tobacco, and limiting alcohol use. What can I expect for my preventive care visit? Physical exam Your health care provider will check your:  Height and weight. This may be used to calculate body mass index (BMI), which tells if you are at a healthy weight.  Heart rate and blood pressure.  Skin for abnormal spots. Counseling Your health care provider may ask you questions about your:  Alcohol, tobacco, and drug use.  Emotional well-being.  Home and relationship well-being.  Sexual activity.  Eating habits.  Work and work environment.  Method of birth control.  Menstrual cycle.  Pregnancy history. What immunizations do I need?  Influenza (flu) vaccine  This is recommended every year. Tetanus, diphtheria, and pertussis (Tdap) vaccine  You may need a Td booster every 10 years. Varicella (chickenpox) vaccine  You may need this if you have not been vaccinated. Zoster (shingles) vaccine  You may need this after age 43. Measles, mumps, and rubella (MMR) vaccine  You may need at least one dose of MMR if you were born in 1957 or later. You may also need a second dose. Pneumococcal conjugate (PCV13) vaccine  You may need this if you have certain conditions and were not previously vaccinated. Pneumococcal polysaccharide (PPSV23) vaccine  You may need one or two doses if you smoke cigarettes or if you have certain conditions. Meningococcal conjugate (MenACWY) vaccine  You may need this if you  have certain conditions. Hepatitis A vaccine  You may need this if you have certain conditions or if you travel or work in places where you may be exposed to hepatitis A. Hepatitis B vaccine  You may need this if you have certain conditions or if you travel or work in places where you may be exposed to hepatitis B. Haemophilus influenzae type b (Hib) vaccine  You may need this if you have certain conditions. Human papillomavirus (HPV) vaccine  If recommended by your health care provider, you may need three doses over 6 months. You may receive vaccines as individual doses or as more than one vaccine together in one shot (combination vaccines). Talk with your health care provider about the risks and benefits of combination vaccines. What tests do I need? Blood tests  Lipid and cholesterol levels. These may be checked every 5 years, or more frequently if you are over 43 years old.  Hepatitis C test.  Hepatitis B test. Screening  Lung cancer screening. You may have this screening every year starting at age 43 if you have a 30-pack-year history of smoking and currently smoke or have quit within the past 15 years.  Colorectal cancer screening. All adults should have this screening starting at age 43 and continuing until age 75. Your health care provider may recommend screening at age 43 if you are at increased risk. You will have tests every 1-10 years, depending on your results and the type of screening test.  Diabetes screening. This is done by checking your blood sugar (glucose) after you have not eaten for a while (fasting). You may have this   done every 1-3 years.  Mammogram. This may be done every 1-2 years. Talk with your health care provider about when you should start having regular mammograms. This may depend on whether you have a family history of breast cancer.  BRCA-related cancer screening. This may be done if you have a family history of breast, ovarian, tubal, or peritoneal  cancers.  Pelvic exam and Pap test. This may be done every 3 years starting at age 43. Starting at age 43, this may be done every 5 years if you have a Pap test in combination with an HPV test. Other tests  Sexually transmitted disease (STD) testing.  Bone density scan. This is done to screen for osteoporosis. You may have this scan if you are at high risk for osteoporosis. Follow these instructions at home: Eating and drinking  Eat a diet that includes fresh fruits and vegetables, whole grains, lean protein, and low-fat dairy.  Take vitamin and mineral supplements as recommended by your health care provider.  Do not drink alcohol if: ? Your health care provider tells you not to drink. ? You are pregnant, may be pregnant, or are planning to become pregnant.  If you drink alcohol: ? Limit how much you have to 0-1 drink a day. ? Be aware of how much alcohol is in your drink. In the [place] one drink equals one 12 oz bottle of beer (355 mL), one 5 oz glass of wine (148 mL), or one 1 oz glass of hard liquor (44 mL). Lifestyle  Take daily care of your teeth and gums.  Stay active. Exercise for at least 30 minutes on 5 or more days each week.  Do not use any products that contain nicotine or tobacco, such as cigarettes, e-cigarettes, and chewing tobacco. If you need help quitting, ask your health care provider.  If you are sexually active, practice safe sex. Use a condom or other form of birth control (contraception) in order to prevent pregnancy and STIs (sexually transmitted infections).  If told by your health care provider, take low-dose aspirin daily starting at age 43. What's next?  Visit your health care provider once a year for a well check visit.  Ask your health care provider how often you should have your eyes and teeth checked.  Stay up to date on all vaccines. This information is not intended to replace advice given to you by your health care provider. Make sure you  discuss any questions you have with your health care provider. Document Revised: 12/25/2017 Document Reviewed: 12/25/2017 Elsevier Patient Education  2020 [employer].

## 2019-08-23 NOTE — Progress Notes (Signed)
GYNECOLOGY ANNUAL PREVENTATIVE CARE ENCOUNTER NOTE  History:     [name] [name] is a 44 y.o. [phone] female here for a routine annual gynecologic exam.  Current complaints: none   Denies abnormal vaginal bleeding, discharge, pelvic pain, problems with intercourse or other gynecologic concerns.    Gynecologic History Patient's last menstrual period was 08/17/2019 (exact date).She missed her period in March thought she might be pregnant. Has had heavey bleeding with clots since 4/20, concerned of miscarriage. UTP faint positive.  Contraception: none,  Last Pap: 06/05/18. Results were: normal with negative HPV Last mammogram: ordered last yr , not done.   Obstetric History OB History  Gravida Para Term Preterm AB Living  4 3 3   1 3   SAB TAB Ectopic Multiple Live Births  1     0 3    # Outcome Date GA Lbr Len/2nd Weight Sex Delivery Anes PTL Lv  4 Term 04/26/15 [redacted]w[redacted]d 08:15 / 00:09 7 lb 9 oz (3.43 kg) M Vag-Spont EPI  LIV  3 SAB 06/2013          2 Term 03/27/11 [redacted]w[redacted]d 17:38 / 00:11 7 lb 15 oz (3.6 kg) M Vag-Spont EPI  LIV  1 Term 10/2009 [redacted]w[redacted]d  7 lb 9 oz (3.43 kg) M Vag-Spont EPI  LIV    Past Medical History:  Diagnosis Date  . Anxiety   . Former smoker   . Medical history non-contributory   . No pertinent past medical history     Past Surgical History:  Procedure Laterality Date  . TONSILLECTOMY    . WISDOM TOOTH EXTRACTION      Current Outpatient Medications on File Prior to Visit  Medication Sig Dispense Refill  . Collagen Hydrolysate POWD by Does not apply route.    . cyanocobalamin (,VITAMIN B-12,) 1000 MCG/ML injection     . diclofenac (VOLTAREN) 75 MG EC tablet Take 1 tablet (75 mg total) by mouth 2 (two) times daily. (Patient not taking: Reported on 08/23/2019) 60 tablet 1  . FLUoxetine (PROZAC) 10 MG tablet Take 1 tablet (10 mg total) by mouth daily. (Patient not taking: Reported on 08/23/2019) 30 tablet 5  . ibuprofen (ADVIL,MOTRIN) 800 MG tablet Take 1 tablet  (800 mg total) by mouth every 8 (eight) hours as needed for moderate pain. (Patient not taking: Reported on 08/23/2019) 30 tablet 0  . methylPREDNISolone (MEDROL DOSEPAK) 4 MG TBPK tablet 6 day dose pack - take as directed (Patient not taking: Reported on 08/23/2019) 21 tablet 0   No current facility-administered medications on file prior to visit.    Allergies  Allergen Reactions  . Codeine     Social History:  reports that she has quit smoking. She has never used smokeless tobacco. She reports that she does not drink alcohol or use drugs.  Family History  Problem Relation Age of Onset  . Migraines Mother   . Breast cancer Paternal Aunt   . Breast cancer Paternal Grandmother   . Breast cancer Maternal Aunt     The following portions of the patient's history were reviewed and updated as appropriate: allergies, current medications, past family history, past medical history, past social history, past surgical history and problem list.   UPT: faint positive  Review of Systems Pertinent items noted in HPI and remainder of comprehensive ROS otherwise negative.  Physical Exam:  BP 132/83   Pulse 79   Ht 5\' 6"  (1.676 m)   Wt 220 lb 3 oz (99.9 kg)  LMP 08/17/2019 (Exact Date)   BMI 35.54 kg/m  CONSTITUTIONAL: Well-developed, well-nourished female in no acute distress.  HENT:  Normocephalic, atraumatic, External right and left ear normal. Oropharynx is clear and moist EYES: Conjunctivae and EOM are normal. Pupils are equal, round, and reactive to light. No scleral icterus.  NECK: Normal range of motion, supple, no masses.  Normal thyroid.  SKIN: Skin is warm and dry. No rash noted. Not diaphoretic. No erythema. No pallor. MUSCULOSKELETAL: Normal range of motion. No tenderness.  No cyanosis, clubbing, or edema.  2+ distal pulses. NEUROLOGIC: Alert and oriented to person, place, and time. Normal reflexes, muscle tone coordination.  PSYCHIATRIC: Normal mood and affect. Normal behavior.  Normal judgment and thought content. CARDIOVASCULAR: Normal heart rate noted, regular rhythm RESPIRATORY: Clear to auscultation bilaterally. Effort and breath sounds normal, no problems with respiration noted. BREASTS: Symmetric in size. No masses, tenderness, skin changes, nipple drainage, or lymphadenopathy bilaterally. Performed in the presence of a chaperone. ABDOMEN: Soft, no distention noted.  No tenderness, rebound or guarding.  PELVIC: Normal appearing external genitalia and urethral meatus; normal appearing vaginal mucosa and cervix.  No abnormal discharge noted.  Pap smear obtained.  Normal uterine size, no other palpable masses, no uterine or adnexal tenderness.  Performed in the presence of a chaperone.   Assessment and Plan:    1. Irregular periods  - Beta HCG, Quant - Beta HCG, Quant; Future  2. Women's annual routine gynecological examination Pap not due Mammogram ordered Labs: BETA quant Refills: none Referral: none Will follow up in Wed/Thurdsday for repeat quant  Routine preventative health maintenance measures emphasized. Is interested in wt loss program. Will follow up once determined if pt has viable pregnancy.  Please refer to After Visit Summary for other counseling recommendations.      Philip Aspen, CNM

## 2019-08-24 LAB — BETA HCG QUANT (REF LAB): hCG Quant: 277 m[IU]/mL

## 2019-08-26 ENCOUNTER — Other Ambulatory Visit: Payer: BC Managed Care – PPO

## 2019-08-26 ENCOUNTER — Other Ambulatory Visit: Payer: Self-pay

## 2019-08-26 DIAGNOSIS — N926 Irregular menstruation, unspecified: Secondary | ICD-10-CM

## 2019-08-27 ENCOUNTER — Other Ambulatory Visit: Payer: Self-pay | Admitting: Certified Nurse Midwife

## 2019-08-27 DIAGNOSIS — O021 Missed abortion: Secondary | ICD-10-CM

## 2019-08-27 LAB — BETA HCG QUANT (REF LAB): hCG Quant: 65 m[IU]/mL

## 2019-08-30 ENCOUNTER — Other Ambulatory Visit: Payer: Self-pay | Admitting: Certified Nurse Midwife

## 2019-08-30 DIAGNOSIS — E669 Obesity, unspecified: Secondary | ICD-10-CM

## 2019-08-30 NOTE — Progress Notes (Signed)
Orders placed for labs for weight loss program.   Doreene Burke, CNM

## 2019-09-01 ENCOUNTER — Other Ambulatory Visit: Payer: BC Managed Care – PPO | Admitting: Orthotics

## 2019-09-07 ENCOUNTER — Ambulatory Visit: Payer: BC Managed Care – PPO | Admitting: Podiatry

## 2019-09-07 ENCOUNTER — Other Ambulatory Visit: Payer: Self-pay

## 2019-09-07 ENCOUNTER — Other Ambulatory Visit: Payer: BC Managed Care – PPO

## 2019-09-07 DIAGNOSIS — M722 Plantar fascial fibromatosis: Secondary | ICD-10-CM | POA: Diagnosis not present

## 2019-09-07 DIAGNOSIS — O021 Missed abortion: Secondary | ICD-10-CM

## 2019-09-07 DIAGNOSIS — E669 Obesity, unspecified: Secondary | ICD-10-CM

## 2019-09-07 MED ORDER — METHYLPREDNISOLONE 4 MG PO TBPK: ORAL_TABLET | ORAL | 0 refills | Status: AC

## 2019-09-07 MED ORDER — IBUPROFEN 800 MG PO TABS: 800.0000 mg | ORAL_TABLET | Freq: Three times a day (TID) | ORAL | 1 refills | Status: AC

## 2019-09-10 LAB — COMPREHENSIVE METABOLIC PANEL
ALT: 18 IU/L (ref 0–32)
AST: 25 IU/L (ref 0–40)
Albumin/Globulin Ratio: 1.7 (ref 1.2–2.2)
Albumin: 4.3 g/dL (ref 3.8–4.8)
Alkaline Phosphatase: 122 IU/L — ABNORMAL HIGH (ref 39–117)
BUN/Creatinine Ratio: 12 (ref 9–23)
BUN: 9 mg/dL (ref 6–24)
Bilirubin Total: 0.2 mg/dL (ref 0.0–1.2)
CO2: 23 mmol/L (ref 20–29)
Calcium: 9.5 mg/dL (ref 8.7–10.2)
Chloride: 103 mmol/L (ref 96–106)
Creatinine, Ser: 0.75 mg/dL (ref 0.57–1.00)
GFR calc Af Amer: 114 mL/min/{1.73_m2} (ref >59)
GFR calc non Af Amer: 99 mL/min/{1.73_m2} (ref >59)
Globulin, Total: 2.6 g/dL (ref 1.5–4.5)
Glucose: 83 mg/dL (ref 65–99)
Potassium: 4.4 mmol/L (ref 3.5–5.2)
Sodium: 141 mmol/L (ref 134–144)
Total Protein: 6.9 g/dL (ref 6.0–8.5)

## 2019-09-10 LAB — LIPID PANEL
Chol/HDL Ratio: 2.6 ratio (ref 0.0–4.4)
Cholesterol, Total: 154 mg/dL (ref 100–199)
HDL: 60 mg/dL (ref >39)
LDL Chol Calc (NIH): 78 mg/dL (ref 0–99)
Triglycerides: 88 mg/dL (ref 0–149)
VLDL Cholesterol Cal: 16 mg/dL (ref 5–40)

## 2019-09-10 LAB — HEMOGLOBIN A1C
Est. average glucose Bld gHb Est-mCnc: 105 mg/dL
Hgb A1c MFr Bld: 5.3 % (ref 4.8–5.6)

## 2019-09-10 LAB — BETA HCG QUANT (REF LAB): hCG Quant: 2 m[IU]/mL

## 2019-09-10 LAB — THYROID PANEL WITH TSH
Free Thyroxine Index: 2.2 (ref 1.2–4.9)
T3 Uptake Ratio: 27 % (ref 24–39)
T4, Total: 8.1 ug/dL (ref 4.5–12.0)
TSH: 1.02 u[IU]/mL (ref 0.450–4.500)

## 2019-09-10 NOTE — Progress Notes (Signed)
   Subjective: 44 y.o. female presenting today for follow up evaluation of bilateral plantar fasciitis. She reports a flare up of sharp, burning pain of the left heel that began a few weeks ago. She was prescribed Medrol and Diclofenac in the past but did not take either. Walking and being on the feet increases the pain. Patient is here for further evaluation and treatment.   Past Medical History:  Diagnosis Date  . Anxiety   . Former smoker   . Medical history non-contributory   . No pertinent past medical history      Objective: Physical Exam General: The patient is alert and oriented x3 in no acute distress.  Dermatology: Skin is warm, dry and supple bilateral lower extremities. Negative for open lesions or macerations bilateral.   Vascular: Dorsalis Pedis and Posterior Tibial pulses palpable bilateral.  Capillary fill time is immediate to all digits.  Neurological: Epicritic and protective threshold intact bilateral.   Musculoskeletal: Tenderness to palpation to the plantar aspect of the bilateral heels along the plantar fascia. All other joints range of motion within normal limits bilateral. Strength 5/5 in all groups bilateral.   Assessment: 1. plantar fasciitis bilateral feet  Plan of Care:  1. Patient evaluated.    2. Injection of 0.5cc Celestone soluspan injected into the bilateral heels.  3. Rx for Medrol Dose Pak placed 4. Rx for Motrin 800 mg ordered for patient. 5. Patient did not take Medrol Dose Pak or Diclofenac prescribed at last visit. Patient thinks she is allergic to Diclofenac.  6. Return to clinic in 4 weeks. If not better, we will discuss surgery.   Has 3 boys ages 29, 9 and 11. Likes to go hiking.   Felecia Shelling, DPM [facility]  Dr. Felecia Shelling, DPM    [address] [place], [place] [address]                Office [phone]  Fax [phone]

## 2019-10-08 ENCOUNTER — Ambulatory Visit: Payer: BC Managed Care – PPO | Admitting: Podiatry

## 2019-11-12 ENCOUNTER — Ambulatory Visit: Payer: BC Managed Care – PPO | Admitting: Podiatry

## 2020-02-07 ENCOUNTER — Other Ambulatory Visit: Payer: BC Managed Care – PPO

## 2020-02-07 DIAGNOSIS — Z20822 Contact with and (suspected) exposure to covid-19: Secondary | ICD-10-CM

## 2020-02-09 LAB — NOVEL CORONAVIRUS, NAA: SARS-CoV-2, NAA: DETECTED — AB

## 2020-02-09 LAB — SARS-COV-2, NAA 2 DAY TAT

## 2020-02-10 ENCOUNTER — Telehealth: Payer: Self-pay | Admitting: Nurse Practitioner

## 2020-02-10 NOTE — Telephone Encounter (Signed)
Called to Discuss with patient about Covid symptoms and the use of the monoclonal antibody infusion for those with mild to moderate Covid symptoms and at a high risk of hospitalization.     Pt appears to qualify for this infusion due to co-morbid conditions and/or a member of an at-risk group in accordance with the FDA Emergency Use Authorization.    Unable to reach pt. Voicemail left, Mychart message sent.   Willette Alma, NP [facility]  [phone]

## 2021-04-28 ENCOUNTER — Other Ambulatory Visit: Payer: Self-pay

## 2021-04-28 ENCOUNTER — Ambulatory Visit
Admission: EM | Admit: 2021-04-28 | Discharge: 2021-04-28 | Disposition: A | Discharge status: Home / Self Care | Payer: BC Managed Care – PPO | Attending: Emergency Medicine | Admitting: Emergency Medicine

## 2021-04-28 ENCOUNTER — Encounter: Payer: Self-pay | Admitting: Emergency Medicine

## 2021-04-28 DIAGNOSIS — J0100 Acute maxillary sinusitis, unspecified: Secondary | ICD-10-CM

## 2021-04-28 MED ORDER — AMOXICILLIN 875 MG PO TABS: 875.0000 mg | ORAL_TABLET | Freq: Two times a day (BID) | ORAL | 0 refills | Status: AC

## 2021-04-28 NOTE — Discharge Instructions (Addendum)
Take the amoxicillin as directed.  Follow up with your primary care provider if your symptoms are not improving.   ° ° °

## 2021-04-28 NOTE — ED Provider Notes (Signed)
[facility]    CSN: [id number] Arrival date & time: 04/28/21  1041      History   Chief Complaint Chief Complaint  Patient presents with   Nasal Congestion   Headache    HPI [name] [name] is a 45 y.o. female.  Patient presents with 10-day history of congestion, sinus pressure, postnasal drip, runny nose, cough.  Treatment at home with ibuprofen and DayQuil.  No fever, rash, earache, sore throat, shortness of breath, or other symptoms.  The history is provided by the patient and medical records.   Past Medical History:  Diagnosis Date   Anxiety    Former smoker    Medical history non-contributory    No pertinent past medical history     Patient Active Problem List   Diagnosis Date Noted   Obesity (BMI 30-39.9) 02/10/2019   Hair loss 06/05/2018   Abnormal weight gain 06/08/2017   Vitamin D deficiency 06/08/2017   GAD (generalized anxiety disorder) 06/08/2017    Past Surgical History:  Procedure Laterality Date   TONSILLECTOMY     WISDOM TOOTH EXTRACTION      OB History     Gravida  4   Para  3   Term  3   Preterm      AB  1   Living  3      SAB  1   IAB      Ectopic      Multiple  0   Live Births  3            Home Medications    Prior to Admission medications   Medication Sig Start Date End Date Taking? Authorizing Provider  amoxicillin (AMOXIL) 875 MG tablet Take 1 tablet (875 mg total) by mouth 2 (two) times daily for 10 days. 04/28/21 05/08/21 Yes Mickie Bail, NP  Collagen Hydrolysate POWD by Does not apply route.    [provider]  cyanocobalamin (,VITAMIN B-12,) 1000 MCG/ML injection  02/10/19   [provider]  diclofenac (VOLTAREN) 75 MG EC tablet Take 1 tablet (75 mg total) by mouth 2 (two) times daily. Patient not taking: Reported on 08/23/2019 07/20/19   Felecia Shelling, DPM  FLUoxetine (PROZAC) 10 MG tablet Take 1 tablet (10 mg total) by mouth daily. Patient not taking: Reported on  08/23/2019 02/10/19   Doreene Burke, CNM  ibuprofen (ADVIL) 800 MG tablet Take 1 tablet (800 mg total) by mouth 3 (three) times daily. 09/07/19   Felecia Shelling, DPM  methylPREDNISolone (MEDROL DOSEPAK) 4 MG TBPK tablet 6 day dose pack - take as directed 09/07/19   Felecia Shelling, DPM    Family History Family History  Problem Relation Age of Onset   Migraines Mother    Breast cancer Paternal Aunt    Breast cancer Paternal Grandmother    Breast cancer Maternal Aunt     Social History Social History   Tobacco Use   Smoking status: Former   Smokeless tobacco: Never  Building services engineer Use: Never used  Substance Use Topics   Alcohol use: No   Drug use: No     Allergies   Codeine   Review of Systems Review of Systems  Constitutional:  Negative for chills and fever.  HENT:  Positive for congestion, postnasal drip, rhinorrhea and sinus pressure. Negative for ear pain and sore throat.   Respiratory:  Positive for cough. Negative for shortness of breath.   Cardiovascular:  Negative  for chest pain and palpitations.  Gastrointestinal:  Negative for diarrhea and vomiting.  Skin:  Negative for color change and rash.  All other systems reviewed and are negative.   Physical Exam Triage Vital Signs ED Triage Vitals  Enc Vitals Group     BP 04/28/21 1118 140/86     Pulse Rate 04/28/21 1118 90     Resp 04/28/21 1118 18     Temp 04/28/21 1118 98.3 F (36.8 C)     Temp Source 04/28/21 1118 Oral     SpO2 04/28/21 1118 98 %     Weight --      Height --      Head Circumference --      Peak Flow --      Pain Score 04/28/21 1119 6     Pain Loc --      Pain Edu? --      Excl. in GC? --    No data found.  Updated Vital Signs BP 140/86    Pulse 90    Temp 98.3 F (36.8 C) (Oral)    Resp 18    SpO2 98%   Visual Acuity Right Eye Distance:   Left Eye Distance:   Bilateral Distance:    Right Eye Near:   Left Eye Near:    Bilateral Near:     Physical Exam Vitals and  nursing note reviewed.  Constitutional:      General: She is not in acute distress.    Appearance: Normal appearance. She is well-developed.  HENT:     Right Ear: Tympanic membrane normal.     Left Ear: Tympanic membrane normal.     Nose: Congestion and rhinorrhea present.     Mouth/Throat:     Mouth: Mucous membranes are moist.     Pharynx: Oropharynx is clear.  Cardiovascular:     Rate and Rhythm: Normal rate and regular rhythm.     Heart sounds: Normal heart sounds.  Pulmonary:     Effort: Pulmonary effort is normal. No respiratory distress.     Breath sounds: Normal breath sounds.  Musculoskeletal:     Cervical back: Neck supple.  Skin:    General: Skin is warm and dry.  Neurological:     Mental Status: She is alert.  Psychiatric:        Mood and Affect: Mood normal.        Behavior: Behavior normal.     UC Treatments / Results  Labs (all labs ordered are listed, but only abnormal results are displayed) Labs Reviewed - No data to display  EKG   Radiology No results found.  Procedures Procedures (including critical care time)  Medications Ordered in UC Medications - No data to display  Initial Impression / Assessment and Plan / UC Course  I have reviewed the triage vital signs and the nursing notes.  Pertinent labs & imaging results that were available during my care of the patient were reviewed by me and considered in my medical decision making (see chart for details).  Acute sinusitis.  Patient has been symptomatic for 10 days and is not improving with OTC treatment.  Treating with amoxicillin.  Instructed her to continue OTC symptomatic treatment.  Instructed her to follow-up with her PCP if her symptoms are not improving.  She agrees to plan of care.   Final Clinical Impressions(s) / UC Diagnoses   Final diagnoses:  Acute non-recurrent maxillary sinusitis     Discharge Instructions  Take the amoxicillin as directed.  Follow up with your  primary care provider if your symptoms are not improving.         ED Prescriptions     Medication Sig Dispense Auth. Provider   amoxicillin (AMOXIL) 875 MG tablet Take 1 tablet (875 mg total) by mouth 2 (two) times daily for 10 days. 20 tablet Mickie Bail, NP      PDMP not reviewed this encounter.   Mickie Bail, NP 04/28/21 [phone]

## 2021-04-28 NOTE — ED Triage Notes (Signed)
Pt here post flu with lingering nasal congestion and headache/head pressure for 9 days.

## 2022-01-15 ENCOUNTER — Other Ambulatory Visit: Payer: Self-pay | Admitting: Internal Medicine

## 2022-01-15 DIAGNOSIS — Z1231 Encounter for screening mammogram for malignant neoplasm of breast: Secondary | ICD-10-CM

## 2022-03-29 DIAGNOSIS — Z419 Encounter for procedure for purposes other than remedying health state, unspecified: Secondary | ICD-10-CM | POA: Diagnosis not present

## 2022-04-29 DIAGNOSIS — Z419 Encounter for procedure for purposes other than remedying health state, unspecified: Secondary | ICD-10-CM | POA: Diagnosis not present

## 2022-05-30 DIAGNOSIS — Z419 Encounter for procedure for purposes other than remedying health state, unspecified: Secondary | ICD-10-CM | POA: Diagnosis not present

## 2023-02-24 ENCOUNTER — Other Ambulatory Visit: Payer: Self-pay | Admitting: Internal Medicine

## 2023-02-24 DIAGNOSIS — Z1231 Encounter for screening mammogram for malignant neoplasm of breast: Secondary | ICD-10-CM

## 2023-07-01 ENCOUNTER — Ambulatory Visit
Admission: RE | Admit: 2023-07-01 | Discharge: 2023-07-01 | Disposition: A | Discharge status: Home / Self Care | Source: Ambulatory Visit | Attending: Emergency Medicine | Admitting: Emergency Medicine

## 2023-07-01 VITALS — BP 151/94 | HR 83 | Temp 98.6°F | Resp 18 | Ht 65.0 in | Wt 205.0 lb

## 2023-07-01 DIAGNOSIS — J029 Acute pharyngitis, unspecified: Secondary | ICD-10-CM

## 2023-07-01 DIAGNOSIS — J069 Acute upper respiratory infection, unspecified: Secondary | ICD-10-CM | POA: Diagnosis not present

## 2023-07-01 LAB — POCT RAPID STREP A (OFFICE): Rapid Strep A Screen: NEGATIVE

## 2023-07-01 MED ORDER — AZITHROMYCIN 250 MG PO TABS: 250.0000 mg | ORAL_TABLET | Freq: Every day | ORAL | 0 refills | Status: AC

## 2023-07-01 NOTE — ED Triage Notes (Signed)
 Pt states that she has a sore throat and swollen lymph nodes. X10 days

## 2023-07-01 NOTE — Discharge Instructions (Addendum)
 Take the Zithromax as directed.  Follow up with your primary care provider if your symptoms are not improving.

## 2023-07-01 NOTE — ED Triage Notes (Signed)
 Pt states she also has some fatigue and loss of voice.

## 2023-07-01 NOTE — ED Provider Notes (Signed)
 Renaldo Fiddler    CSN: [id number] Arrival date & time: 07/01/23  1854      History   Chief Complaint Chief Complaint  Patient presents with   Sore Throat    Swollen lymph nodes - Entered by patient    HPI [name] [name] is a 48 y.o. female.  Patient presents with 10-day history of sore throat, hoarse voice, swollen lymph nodes, postnasal drip, congestion, occasional cough, fatigue.  No fever, chills, rash, shortness of breath.  No OTC medication taken today.  Her medical history includes tonsillectomy.  The history is provided by the patient and medical records.    Past Medical History:  Diagnosis Date   Anxiety    Former smoker    Medical history non-contributory    No pertinent past medical history     Patient Active Problem List   Diagnosis Date Noted   Obesity (BMI 30-39.9) 02/10/2019   Hair loss 06/05/2018   Abnormal weight gain 06/08/2017   Vitamin D deficiency 06/08/2017   GAD (generalized anxiety disorder) 06/08/2017    Past Surgical History:  Procedure Laterality Date   TONSILLECTOMY     WISDOM TOOTH EXTRACTION      OB History     Gravida  4   Para  3   Term  3   Preterm      AB  1   Living  3      SAB  1   IAB      Ectopic      Multiple  0   Live Births  3            Home Medications    Prior to Admission medications   Medication Sig Start Date End Date Taking? Authorizing Provider  escitalopram (LEXAPRO) 5 MG tablet Take 5 mg by mouth daily. 05/07/23  Yes [provider]  azithromycin (ZITHROMAX) 250 MG tablet Take 1 tablet (250 mg total) by mouth daily. Take first 2 tablets together, then 1 every day until finished. 07/01/23  Yes Mickie Bail, NP  Collagen Hydrolysate POWD by Does not apply route.    [provider]  cyanocobalamin (,VITAMIN B-12,) 1000 MCG/ML injection  02/10/19   [provider]  diclofenac (VOLTAREN) 75 MG EC tablet Take 1 tablet (75 mg total) by mouth 2 (two) times  daily. Patient not taking: Reported on 08/23/2019 07/20/19   Felecia Shelling, DPM  FLUoxetine (PROZAC) 10 MG tablet Take 1 tablet (10 mg total) by mouth daily. Patient not taking: Reported on 08/23/2019 02/10/19   Doreene Burke, CNM  ibuprofen (ADVIL) 800 MG tablet Take 1 tablet (800 mg total) by mouth 3 (three) times daily. 09/07/19   Felecia Shelling, DPM  methylPREDNISolone (MEDROL DOSEPAK) 4 MG TBPK tablet 6 day dose pack - take as directed 09/07/19   Felecia Shelling, DPM    Family History Family History  Problem Relation Age of Onset   Migraines Mother    Breast cancer Paternal Aunt    Breast cancer Paternal Grandmother    Breast cancer Maternal Aunt     Social History Social History   Tobacco Use   Smoking status: Former   Smokeless tobacco: Never  Vaping Use   Vaping status: Never Used  Substance Use Topics   Alcohol use: No   Drug use: No     Allergies   Codeine   Review of Systems Review of Systems  Constitutional:  Positive for fatigue. Negative for chills  and fever.  HENT:  Positive for ear pain, postnasal drip and sore throat.   Respiratory:  Positive for cough. Negative for shortness of breath.      Physical Exam Triage Vital Signs ED Triage Vitals  Encounter Vitals Group     BP      Systolic BP Percentile      Diastolic BP Percentile      Pulse      Resp      Temp      Temp src      SpO2      Weight      Height      Head Circumference      Peak Flow      Pain Score      Pain Loc      Pain Education      Exclude from Growth Chart    No data found.  Updated Vital Signs BP (!) 151/94 (BP Location: Left Arm)   Pulse 83   Temp 98.6 F (37 C)   Resp 18   Ht 5\' 5"  (1.651 m)   Wt 205 lb (93 kg)   LMP 06/15/2023   SpO2 98%   BMI 34.11 kg/m   Visual Acuity Right Eye Distance:   Left Eye Distance:   Bilateral Distance:    Right Eye Near:   Left Eye Near:    Bilateral Near:     Physical Exam Constitutional:      General: She is not  in acute distress. HENT:     Right Ear: Tympanic membrane normal.     Left Ear: Tympanic membrane normal.     Nose: Nose normal.     Mouth/Throat:     Mouth: Mucous membranes are moist.     Pharynx: Posterior oropharyngeal erythema present.     Comments: PND Cardiovascular:     Rate and Rhythm: Normal rate and regular rhythm.     Heart sounds: Normal heart sounds.  Pulmonary:     Effort: Pulmonary effort is normal. No respiratory distress.     Breath sounds: Normal breath sounds.  Musculoskeletal:     Cervical back: Neck supple.  Lymphadenopathy:     Cervical: No cervical adenopathy.  Neurological:     Mental Status: She is alert.      UC Treatments / Results  Labs (all labs ordered are listed, but only abnormal results are displayed) Labs Reviewed  POCT RAPID STREP A (OFFICE) - Normal    EKG   Radiology No results found.  Procedures Procedures (including critical care time)  Medications Ordered in UC Medications - No data to display  Initial Impression / Assessment and Plan / UC Course  I have reviewed the triage vital signs and the nursing notes.  Pertinent labs & imaging results that were available during my care of the patient were reviewed by me and considered in my medical decision making (see chart for details).    Sore throat, acute URI.  Rapid strep negative.  Patient has been symptomatic for 10 days.  Treating today with Zithromax.  Tylenol or ibuprofen as needed.  Plain Mucinex as needed.  Instructed patient to follow up with her PCP if her symptoms are not improving.  She agrees to plan of care.   Final Clinical Impressions(s) / UC Diagnoses   Final diagnoses:  Sore throat  Acute upper respiratory infection     Discharge Instructions      Take the Zithromax as directed.  Follow-up  with your primary care provider if your symptoms are not improving.      ED Prescriptions     Medication Sig Dispense Auth. Provider   azithromycin  (ZITHROMAX) 250 MG tablet Take 1 tablet (250 mg total) by mouth daily. Take first 2 tablets together, then 1 every day until finished. 6 tablet Mickie Bail, NP      PDMP not reviewed this encounter.   Mickie Bail, NP 07/01/23 [phone]

## 2023-08-11 ENCOUNTER — Ambulatory Visit
Admission: RE | Admit: 2023-08-11 | Discharge: 2023-08-11 | Disposition: A | Discharge status: Home / Self Care | Source: Ambulatory Visit | Attending: Internal Medicine | Admitting: Internal Medicine

## 2023-08-11 DIAGNOSIS — Z1231 Encounter for screening mammogram for malignant neoplasm of breast: Secondary | ICD-10-CM | POA: Insufficient documentation

## 2023-08-14 ENCOUNTER — Other Ambulatory Visit: Payer: Self-pay | Admitting: Internal Medicine

## 2023-08-14 DIAGNOSIS — R928 Other abnormal and inconclusive findings on diagnostic imaging of breast: Secondary | ICD-10-CM

## 2023-08-18 ENCOUNTER — Ambulatory Visit
Admission: RE | Admit: 2023-08-18 | Discharge: 2023-08-18 | Disposition: A | Discharge status: Home / Self Care | Source: Ambulatory Visit | Attending: Internal Medicine | Admitting: Internal Medicine

## 2023-08-18 DIAGNOSIS — R928 Other abnormal and inconclusive findings on diagnostic imaging of breast: Secondary | ICD-10-CM | POA: Insufficient documentation

## 2023-08-20 ENCOUNTER — Other Ambulatory Visit: Payer: Self-pay | Admitting: Internal Medicine

## 2023-08-20 DIAGNOSIS — R928 Other abnormal and inconclusive findings on diagnostic imaging of breast: Secondary | ICD-10-CM

## 2023-08-21 ENCOUNTER — Ambulatory Visit
Admission: RE | Admit: 2023-08-21 | Discharge: 2023-08-21 | Discharge status: Home / Self Care | Source: Ambulatory Visit | Attending: Internal Medicine | Admitting: Internal Medicine

## 2023-08-21 ENCOUNTER — Ambulatory Visit
Admission: RE | Admit: 2023-08-21 | Discharge: 2023-08-21 | Disposition: A | Discharge status: Home / Self Care | Source: Ambulatory Visit | Attending: Internal Medicine | Admitting: Internal Medicine

## 2023-08-21 ENCOUNTER — Ambulatory Visit: Admission: RE | Admit: 2023-08-21 | Source: Ambulatory Visit

## 2023-08-21 DIAGNOSIS — N6002 Solitary cyst of left breast: Secondary | ICD-10-CM | POA: Insufficient documentation

## 2023-08-21 DIAGNOSIS — R928 Other abnormal and inconclusive findings on diagnostic imaging of breast: Secondary | ICD-10-CM | POA: Diagnosis present

## 2023-08-21 DIAGNOSIS — N6325 Unspecified lump in the left breast, overlapping quadrants: Secondary | ICD-10-CM | POA: Diagnosis not present

## 2023-08-21 MED ORDER — CHLOROPROCAINE HCL (PF) 3 % IJ SOLN
10.0000 mL | Freq: Once | INTRAMUSCULAR | Status: DC
  Filled 2023-08-21: qty 10

## 2023-12-18 ENCOUNTER — Encounter

## 2024-01-08 ENCOUNTER — Ambulatory Visit

## 2024-01-08 DIAGNOSIS — K5730 Diverticulosis of large intestine without perforation or abscess without bleeding: Secondary | ICD-10-CM | POA: Diagnosis not present

## 2024-01-08 DIAGNOSIS — Z1211 Encounter for screening for malignant neoplasm of colon: Secondary | ICD-10-CM | POA: Diagnosis not present

## 2024-01-08 DIAGNOSIS — K640 First degree hemorrhoids: Secondary | ICD-10-CM | POA: Diagnosis not present

## 2024-01-08 DIAGNOSIS — D123 Benign neoplasm of transverse colon: Secondary | ICD-10-CM | POA: Diagnosis not present
